# Patient Record
Sex: Female | Born: 1955 | Race: White | Hispanic: No | Smoking: Never smoker
Health system: Southern US, Community
[De-identification: ages and names within clinical notes are randomized; demographics above are authoritative.]

## PROBLEM LIST (undated history)

## (undated) DIAGNOSIS — J45909 Unspecified asthma, uncomplicated: Secondary | ICD-10-CM

## (undated) DIAGNOSIS — G629 Polyneuropathy, unspecified: Secondary | ICD-10-CM

## (undated) DIAGNOSIS — F419 Anxiety disorder, unspecified: Secondary | ICD-10-CM

## (undated) DIAGNOSIS — K219 Gastro-esophageal reflux disease without esophagitis: Secondary | ICD-10-CM

## (undated) DIAGNOSIS — I10 Essential (primary) hypertension: Secondary | ICD-10-CM

## (undated) DIAGNOSIS — G4730 Sleep apnea, unspecified: Secondary | ICD-10-CM

## (undated) DIAGNOSIS — T8859XA Other complications of anesthesia, initial encounter: Secondary | ICD-10-CM

## (undated) HISTORY — PX: HERNIA REPAIR: SHX51

## (undated) HISTORY — PX: COLOSTOMY TAKEDOWN: SHX5258

## (undated) HISTORY — PX: ILEOSTOMY: SHX1783

## (undated) HISTORY — PX: CHOLECYSTECTOMY: SHX55

## (undated) HISTORY — PX: COLOSTOMY: SHX63

## (undated) HISTORY — PX: ILEOSTOMY CLOSURE: SHX1784

## (undated) HISTORY — PX: APPENDECTOMY: SHX54

## (undated) HISTORY — PX: ABDOMINAL HYSTERECTOMY: SHX81

---

## 1998-06-12 ENCOUNTER — Other Ambulatory Visit: Admission: RE | Admit: 1998-06-12 | Discharge: 1998-06-12 | Payer: Self-pay | Admitting: Family Medicine

## 2000-07-26 ENCOUNTER — Encounter: Payer: Self-pay | Admitting: Urology

## 2000-07-26 ENCOUNTER — Encounter: Admission: RE | Admit: 2000-07-26 | Discharge: 2000-07-26 | Payer: Self-pay | Admitting: Urology

## 2000-08-29 ENCOUNTER — Ambulatory Visit (HOSPITAL_COMMUNITY): Admission: RE | Admit: 2000-08-29 | Discharge: 2000-08-29 | Payer: Self-pay | Admitting: Urology

## 2003-08-09 ENCOUNTER — Inpatient Hospital Stay (HOSPITAL_COMMUNITY): Admission: EM | Admit: 2003-08-09 | Discharge: 2003-08-11 | Payer: Self-pay | Admitting: Internal Medicine

## 2015-06-11 DIAGNOSIS — E669 Obesity, unspecified: Secondary | ICD-10-CM | POA: Diagnosis not present

## 2015-06-11 DIAGNOSIS — Z6841 Body Mass Index (BMI) 40.0 and over, adult: Secondary | ICD-10-CM | POA: Diagnosis not present

## 2015-06-11 DIAGNOSIS — Z87891 Personal history of nicotine dependence: Secondary | ICD-10-CM | POA: Diagnosis not present

## 2015-06-11 DIAGNOSIS — R109 Unspecified abdominal pain: Secondary | ICD-10-CM | POA: Diagnosis not present

## 2015-06-11 DIAGNOSIS — Z90710 Acquired absence of both cervix and uterus: Secondary | ICD-10-CM | POA: Diagnosis not present

## 2015-06-11 DIAGNOSIS — Z79891 Long term (current) use of opiate analgesic: Secondary | ICD-10-CM | POA: Diagnosis not present

## 2015-06-11 DIAGNOSIS — Z9889 Other specified postprocedural states: Secondary | ICD-10-CM | POA: Diagnosis not present

## 2015-06-11 DIAGNOSIS — G8929 Other chronic pain: Secondary | ICD-10-CM | POA: Diagnosis not present

## 2015-06-11 DIAGNOSIS — I10 Essential (primary) hypertension: Secondary | ICD-10-CM | POA: Diagnosis not present

## 2015-06-11 DIAGNOSIS — Z9049 Acquired absence of other specified parts of digestive tract: Secondary | ICD-10-CM | POA: Diagnosis not present

## 2015-06-11 DIAGNOSIS — Z7982 Long term (current) use of aspirin: Secondary | ICD-10-CM | POA: Diagnosis not present

## 2015-06-11 DIAGNOSIS — Z79899 Other long term (current) drug therapy: Secondary | ICD-10-CM | POA: Diagnosis not present

## 2015-06-11 DIAGNOSIS — Z885 Allergy status to narcotic agent status: Secondary | ICD-10-CM | POA: Diagnosis not present

## 2015-06-23 DIAGNOSIS — I10 Essential (primary) hypertension: Secondary | ICD-10-CM | POA: Diagnosis not present

## 2015-06-23 DIAGNOSIS — E669 Obesity, unspecified: Secondary | ICD-10-CM | POA: Diagnosis not present

## 2015-06-23 DIAGNOSIS — K660 Peritoneal adhesions (postprocedural) (postinfection): Secondary | ICD-10-CM | POA: Diagnosis not present

## 2015-06-23 DIAGNOSIS — F341 Dysthymic disorder: Secondary | ICD-10-CM | POA: Diagnosis not present

## 2015-06-23 DIAGNOSIS — R109 Unspecified abdominal pain: Secondary | ICD-10-CM | POA: Diagnosis not present

## 2015-06-23 DIAGNOSIS — Z6841 Body Mass Index (BMI) 40.0 and over, adult: Secondary | ICD-10-CM | POA: Diagnosis not present

## 2015-07-09 DIAGNOSIS — R1314 Dysphagia, pharyngoesophageal phase: Secondary | ICD-10-CM | POA: Diagnosis not present

## 2015-07-09 DIAGNOSIS — Z7982 Long term (current) use of aspirin: Secondary | ICD-10-CM | POA: Diagnosis not present

## 2015-07-09 DIAGNOSIS — K219 Gastro-esophageal reflux disease without esophagitis: Secondary | ICD-10-CM | POA: Diagnosis not present

## 2015-07-09 DIAGNOSIS — Z87891 Personal history of nicotine dependence: Secondary | ICD-10-CM | POA: Diagnosis not present

## 2015-07-09 DIAGNOSIS — K625 Hemorrhage of anus and rectum: Secondary | ICD-10-CM | POA: Diagnosis not present

## 2015-07-15 DIAGNOSIS — Z6841 Body Mass Index (BMI) 40.0 and over, adult: Secondary | ICD-10-CM | POA: Diagnosis not present

## 2015-07-15 DIAGNOSIS — J45901 Unspecified asthma with (acute) exacerbation: Secondary | ICD-10-CM | POA: Diagnosis not present

## 2015-07-15 DIAGNOSIS — H6691 Otitis media, unspecified, right ear: Secondary | ICD-10-CM | POA: Diagnosis not present

## 2015-07-15 DIAGNOSIS — J399 Disease of upper respiratory tract, unspecified: Secondary | ICD-10-CM | POA: Diagnosis not present

## 2015-07-16 DIAGNOSIS — F341 Dysthymic disorder: Secondary | ICD-10-CM | POA: Diagnosis not present

## 2015-07-16 DIAGNOSIS — E669 Obesity, unspecified: Secondary | ICD-10-CM | POA: Diagnosis not present

## 2015-07-16 DIAGNOSIS — I10 Essential (primary) hypertension: Secondary | ICD-10-CM | POA: Diagnosis not present

## 2015-07-16 DIAGNOSIS — Z6841 Body Mass Index (BMI) 40.0 and over, adult: Secondary | ICD-10-CM | POA: Diagnosis not present

## 2015-07-25 DIAGNOSIS — J180 Bronchopneumonia, unspecified organism: Secondary | ICD-10-CM | POA: Diagnosis not present

## 2015-07-25 DIAGNOSIS — Z6841 Body Mass Index (BMI) 40.0 and over, adult: Secondary | ICD-10-CM | POA: Diagnosis not present

## 2015-08-14 DIAGNOSIS — K210 Gastro-esophageal reflux disease with esophagitis: Secondary | ICD-10-CM | POA: Diagnosis not present

## 2015-08-14 DIAGNOSIS — Z79899 Other long term (current) drug therapy: Secondary | ICD-10-CM | POA: Diagnosis not present

## 2015-08-14 DIAGNOSIS — Z6837 Body mass index (BMI) 37.0-37.9, adult: Secondary | ICD-10-CM | POA: Diagnosis not present

## 2015-08-14 DIAGNOSIS — K219 Gastro-esophageal reflux disease without esophagitis: Secondary | ICD-10-CM | POA: Diagnosis not present

## 2015-08-14 DIAGNOSIS — R569 Unspecified convulsions: Secondary | ICD-10-CM | POA: Diagnosis not present

## 2015-08-14 DIAGNOSIS — Z8719 Personal history of other diseases of the digestive system: Secondary | ICD-10-CM | POA: Diagnosis not present

## 2015-08-14 DIAGNOSIS — Z7982 Long term (current) use of aspirin: Secondary | ICD-10-CM | POA: Diagnosis not present

## 2015-08-14 DIAGNOSIS — Z885 Allergy status to narcotic agent status: Secondary | ICD-10-CM | POA: Diagnosis not present

## 2015-08-14 DIAGNOSIS — R1310 Dysphagia, unspecified: Secondary | ICD-10-CM | POA: Diagnosis not present

## 2015-08-14 DIAGNOSIS — G4730 Sleep apnea, unspecified: Secondary | ICD-10-CM | POA: Diagnosis not present

## 2015-08-14 DIAGNOSIS — G894 Chronic pain syndrome: Secondary | ICD-10-CM | POA: Diagnosis not present

## 2015-08-14 DIAGNOSIS — Z1211 Encounter for screening for malignant neoplasm of colon: Secondary | ICD-10-CM | POA: Diagnosis not present

## 2015-08-14 DIAGNOSIS — Z91041 Radiographic dye allergy status: Secondary | ICD-10-CM | POA: Diagnosis not present

## 2015-08-14 DIAGNOSIS — Z87891 Personal history of nicotine dependence: Secondary | ICD-10-CM | POA: Diagnosis not present

## 2015-08-14 DIAGNOSIS — Z90710 Acquired absence of both cervix and uterus: Secondary | ICD-10-CM | POA: Diagnosis not present

## 2015-08-14 DIAGNOSIS — Z9049 Acquired absence of other specified parts of digestive tract: Secondary | ICD-10-CM | POA: Diagnosis not present

## 2015-08-14 DIAGNOSIS — G629 Polyneuropathy, unspecified: Secondary | ICD-10-CM | POA: Diagnosis not present

## 2015-08-14 DIAGNOSIS — F419 Anxiety disorder, unspecified: Secondary | ICD-10-CM | POA: Diagnosis not present

## 2015-08-14 DIAGNOSIS — K625 Hemorrhage of anus and rectum: Secondary | ICD-10-CM | POA: Diagnosis not present

## 2015-08-14 DIAGNOSIS — K208 Other esophagitis: Secondary | ICD-10-CM | POA: Diagnosis not present

## 2015-08-14 DIAGNOSIS — I10 Essential (primary) hypertension: Secondary | ICD-10-CM | POA: Diagnosis not present

## 2015-08-14 DIAGNOSIS — Z9119 Patient's noncompliance with other medical treatment and regimen: Secondary | ICD-10-CM | POA: Diagnosis not present

## 2015-08-19 DIAGNOSIS — Z1231 Encounter for screening mammogram for malignant neoplasm of breast: Secondary | ICD-10-CM | POA: Diagnosis not present

## 2015-09-11 DIAGNOSIS — F1120 Opioid dependence, uncomplicated: Secondary | ICD-10-CM | POA: Diagnosis not present

## 2015-09-11 DIAGNOSIS — R109 Unspecified abdominal pain: Secondary | ICD-10-CM | POA: Diagnosis not present

## 2015-09-11 DIAGNOSIS — G894 Chronic pain syndrome: Secondary | ICD-10-CM | POA: Diagnosis not present

## 2015-09-11 DIAGNOSIS — G4730 Sleep apnea, unspecified: Secondary | ICD-10-CM | POA: Diagnosis not present

## 2015-09-11 DIAGNOSIS — F419 Anxiety disorder, unspecified: Secondary | ICD-10-CM | POA: Diagnosis not present

## 2015-10-22 DIAGNOSIS — G8929 Other chronic pain: Secondary | ICD-10-CM | POA: Diagnosis not present

## 2015-10-22 DIAGNOSIS — Z5181 Encounter for therapeutic drug level monitoring: Secondary | ICD-10-CM | POA: Diagnosis not present

## 2015-10-22 DIAGNOSIS — Z79899 Other long term (current) drug therapy: Secondary | ICD-10-CM | POA: Diagnosis not present

## 2015-10-22 DIAGNOSIS — R109 Unspecified abdominal pain: Secondary | ICD-10-CM | POA: Diagnosis not present

## 2015-10-29 DIAGNOSIS — L02211 Cutaneous abscess of abdominal wall: Secondary | ICD-10-CM | POA: Diagnosis not present

## 2015-10-30 DIAGNOSIS — L02211 Cutaneous abscess of abdominal wall: Secondary | ICD-10-CM | POA: Diagnosis not present

## 2016-04-21 DIAGNOSIS — R600 Localized edema: Secondary | ICD-10-CM | POA: Diagnosis not present

## 2016-04-21 DIAGNOSIS — M79604 Pain in right leg: Secondary | ICD-10-CM | POA: Diagnosis not present

## 2016-04-21 DIAGNOSIS — Z7689 Persons encountering health services in other specified circumstances: Secondary | ICD-10-CM | POA: Diagnosis not present

## 2016-04-27 DIAGNOSIS — E1165 Type 2 diabetes mellitus with hyperglycemia: Secondary | ICD-10-CM | POA: Diagnosis not present

## 2016-04-27 DIAGNOSIS — M79604 Pain in right leg: Secondary | ICD-10-CM | POA: Diagnosis not present

## 2016-04-27 DIAGNOSIS — Z6841 Body Mass Index (BMI) 40.0 and over, adult: Secondary | ICD-10-CM | POA: Diagnosis not present

## 2016-04-27 DIAGNOSIS — Z713 Dietary counseling and surveillance: Secondary | ICD-10-CM | POA: Diagnosis not present

## 2016-04-27 DIAGNOSIS — R600 Localized edema: Secondary | ICD-10-CM | POA: Diagnosis not present

## 2016-05-12 DIAGNOSIS — R600 Localized edema: Secondary | ICD-10-CM | POA: Diagnosis not present

## 2016-05-12 DIAGNOSIS — E119 Type 2 diabetes mellitus without complications: Secondary | ICD-10-CM | POA: Diagnosis not present

## 2016-05-12 DIAGNOSIS — Z713 Dietary counseling and surveillance: Secondary | ICD-10-CM | POA: Diagnosis not present

## 2016-05-12 DIAGNOSIS — I10 Essential (primary) hypertension: Secondary | ICD-10-CM | POA: Diagnosis not present

## 2016-05-12 DIAGNOSIS — E669 Obesity, unspecified: Secondary | ICD-10-CM | POA: Diagnosis not present

## 2016-05-12 DIAGNOSIS — Z6841 Body Mass Index (BMI) 40.0 and over, adult: Secondary | ICD-10-CM | POA: Diagnosis not present

## 2016-05-25 DIAGNOSIS — E1165 Type 2 diabetes mellitus with hyperglycemia: Secondary | ICD-10-CM | POA: Diagnosis not present

## 2016-06-09 DIAGNOSIS — E669 Obesity, unspecified: Secondary | ICD-10-CM | POA: Diagnosis not present

## 2016-06-09 DIAGNOSIS — I10 Essential (primary) hypertension: Secondary | ICD-10-CM | POA: Diagnosis not present

## 2016-06-09 DIAGNOSIS — E119 Type 2 diabetes mellitus without complications: Secondary | ICD-10-CM | POA: Diagnosis not present

## 2016-06-09 DIAGNOSIS — Z6841 Body Mass Index (BMI) 40.0 and over, adult: Secondary | ICD-10-CM | POA: Diagnosis not present

## 2016-07-15 DIAGNOSIS — E1165 Type 2 diabetes mellitus with hyperglycemia: Secondary | ICD-10-CM | POA: Diagnosis not present

## 2017-05-12 DIAGNOSIS — E119 Type 2 diabetes mellitus without complications: Secondary | ICD-10-CM | POA: Diagnosis not present

## 2017-05-12 DIAGNOSIS — H25813 Combined forms of age-related cataract, bilateral: Secondary | ICD-10-CM | POA: Diagnosis not present

## 2017-05-12 DIAGNOSIS — H11153 Pinguecula, bilateral: Secondary | ICD-10-CM | POA: Diagnosis not present

## 2017-06-23 DIAGNOSIS — G4737 Central sleep apnea in conditions classified elsewhere: Secondary | ICD-10-CM | POA: Diagnosis not present

## 2017-06-23 DIAGNOSIS — I10 Essential (primary) hypertension: Secondary | ICD-10-CM | POA: Diagnosis not present

## 2017-06-23 DIAGNOSIS — G629 Polyneuropathy, unspecified: Secondary | ICD-10-CM | POA: Diagnosis not present

## 2017-06-23 DIAGNOSIS — Z1159 Encounter for screening for other viral diseases: Secondary | ICD-10-CM | POA: Diagnosis not present

## 2017-07-21 DIAGNOSIS — Z1231 Encounter for screening mammogram for malignant neoplasm of breast: Secondary | ICD-10-CM | POA: Diagnosis not present

## 2017-07-21 DIAGNOSIS — M85851 Other specified disorders of bone density and structure, right thigh: Secondary | ICD-10-CM | POA: Diagnosis not present

## 2017-07-21 DIAGNOSIS — N959 Unspecified menopausal and perimenopausal disorder: Secondary | ICD-10-CM | POA: Diagnosis not present

## 2017-07-25 DIAGNOSIS — R05 Cough: Secondary | ICD-10-CM | POA: Diagnosis not present

## 2017-07-25 DIAGNOSIS — J208 Acute bronchitis due to other specified organisms: Secondary | ICD-10-CM | POA: Diagnosis not present

## 2017-07-25 DIAGNOSIS — B9689 Other specified bacterial agents as the cause of diseases classified elsewhere: Secondary | ICD-10-CM | POA: Diagnosis not present

## 2017-08-09 DIAGNOSIS — R062 Wheezing: Secondary | ICD-10-CM | POA: Diagnosis not present

## 2017-08-09 DIAGNOSIS — Z0000 Encounter for general adult medical examination without abnormal findings: Secondary | ICD-10-CM | POA: Diagnosis not present

## 2017-08-09 DIAGNOSIS — E1142 Type 2 diabetes mellitus with diabetic polyneuropathy: Secondary | ICD-10-CM | POA: Diagnosis not present

## 2017-08-09 DIAGNOSIS — E1165 Type 2 diabetes mellitus with hyperglycemia: Secondary | ICD-10-CM | POA: Diagnosis not present

## 2017-08-09 DIAGNOSIS — Z13220 Encounter for screening for lipoid disorders: Secondary | ICD-10-CM | POA: Diagnosis not present

## 2017-08-09 DIAGNOSIS — I10 Essential (primary) hypertension: Secondary | ICD-10-CM | POA: Diagnosis not present

## 2017-08-09 DIAGNOSIS — K219 Gastro-esophageal reflux disease without esophagitis: Secondary | ICD-10-CM | POA: Diagnosis not present

## 2017-08-09 DIAGNOSIS — E559 Vitamin D deficiency, unspecified: Secondary | ICD-10-CM | POA: Diagnosis not present

## 2020-09-01 ENCOUNTER — Telehealth: Payer: Self-pay | Admitting: Family Medicine

## 2020-09-01 ENCOUNTER — Inpatient Hospital Stay (HOSPITAL_COMMUNITY)
Admission: AD | Admit: 2020-09-01 | Discharge: 2020-09-15 | DRG: 480 | Disposition: A | Discharge status: Home Health | Payer: Medicare HMO | Source: Other Acute Inpatient Hospital | Attending: Internal Medicine | Admitting: Internal Medicine

## 2020-09-01 DIAGNOSIS — J9602 Acute respiratory failure with hypercapnia: Secondary | ICD-10-CM | POA: Diagnosis present

## 2020-09-01 DIAGNOSIS — E1122 Type 2 diabetes mellitus with diabetic chronic kidney disease: Secondary | ICD-10-CM | POA: Diagnosis present

## 2020-09-01 DIAGNOSIS — W1830XA Fall on same level, unspecified, initial encounter: Secondary | ICD-10-CM | POA: Diagnosis present

## 2020-09-01 DIAGNOSIS — Z91041 Radiographic dye allergy status: Secondary | ICD-10-CM | POA: Diagnosis not present

## 2020-09-01 DIAGNOSIS — Z419 Encounter for procedure for purposes other than remedying health state, unspecified: Secondary | ICD-10-CM

## 2020-09-01 DIAGNOSIS — S7291XD Unspecified fracture of right femur, subsequent encounter for closed fracture with routine healing: Secondary | ICD-10-CM | POA: Diagnosis not present

## 2020-09-01 DIAGNOSIS — K219 Gastro-esophageal reflux disease without esophagitis: Secondary | ICD-10-CM | POA: Diagnosis present

## 2020-09-01 DIAGNOSIS — M7041 Prepatellar bursitis, right knee: Secondary | ICD-10-CM | POA: Diagnosis present

## 2020-09-01 DIAGNOSIS — G9341 Metabolic encephalopathy: Secondary | ICD-10-CM | POA: Diagnosis present

## 2020-09-01 DIAGNOSIS — Z885 Allergy status to narcotic agent status: Secondary | ICD-10-CM | POA: Diagnosis not present

## 2020-09-01 DIAGNOSIS — S72451A Displaced supracondylar fracture without intracondylar extension of lower end of right femur, initial encounter for closed fracture: Principal | ICD-10-CM | POA: Diagnosis present

## 2020-09-01 DIAGNOSIS — E1140 Type 2 diabetes mellitus with diabetic neuropathy, unspecified: Secondary | ICD-10-CM | POA: Diagnosis present

## 2020-09-01 DIAGNOSIS — Z6841 Body Mass Index (BMI) 40.0 and over, adult: Secondary | ICD-10-CM

## 2020-09-01 DIAGNOSIS — Z20822 Contact with and (suspected) exposure to covid-19: Secondary | ICD-10-CM | POA: Diagnosis present

## 2020-09-01 DIAGNOSIS — G4733 Obstructive sleep apnea (adult) (pediatric): Secondary | ICD-10-CM | POA: Diagnosis present

## 2020-09-01 DIAGNOSIS — N1832 Chronic kidney disease, stage 3b: Secondary | ICD-10-CM | POA: Diagnosis present

## 2020-09-01 DIAGNOSIS — D638 Anemia in other chronic diseases classified elsewhere: Secondary | ICD-10-CM | POA: Diagnosis present

## 2020-09-01 DIAGNOSIS — J9601 Acute respiratory failure with hypoxia: Secondary | ICD-10-CM | POA: Diagnosis present

## 2020-09-01 DIAGNOSIS — Z823 Family history of stroke: Secondary | ICD-10-CM | POA: Diagnosis not present

## 2020-09-01 DIAGNOSIS — Z8249 Family history of ischemic heart disease and other diseases of the circulatory system: Secondary | ICD-10-CM | POA: Diagnosis not present

## 2020-09-01 DIAGNOSIS — Z7984 Long term (current) use of oral hypoglycemic drugs: Secondary | ICD-10-CM

## 2020-09-01 DIAGNOSIS — E11649 Type 2 diabetes mellitus with hypoglycemia without coma: Secondary | ICD-10-CM | POA: Diagnosis present

## 2020-09-01 DIAGNOSIS — S7291XA Unspecified fracture of right femur, initial encounter for closed fracture: Secondary | ICD-10-CM | POA: Diagnosis not present

## 2020-09-01 DIAGNOSIS — S7290XA Unspecified fracture of unspecified femur, initial encounter for closed fracture: Secondary | ICD-10-CM | POA: Diagnosis present

## 2020-09-01 DIAGNOSIS — I1310 Hypertensive heart and chronic kidney disease without heart failure, with stage 1 through stage 4 chronic kidney disease, or unspecified chronic kidney disease: Secondary | ICD-10-CM | POA: Diagnosis present

## 2020-09-01 DIAGNOSIS — Y92512 Supermarket, store or market as the place of occurrence of the external cause: Secondary | ICD-10-CM | POA: Diagnosis not present

## 2020-09-01 DIAGNOSIS — Z833 Family history of diabetes mellitus: Secondary | ICD-10-CM | POA: Diagnosis not present

## 2020-09-01 DIAGNOSIS — E119 Type 2 diabetes mellitus without complications: Secondary | ICD-10-CM

## 2020-09-01 DIAGNOSIS — J9600 Acute respiratory failure, unspecified whether with hypoxia or hypercapnia: Secondary | ICD-10-CM

## 2020-09-01 DIAGNOSIS — D62 Acute posthemorrhagic anemia: Secondary | ICD-10-CM | POA: Diagnosis not present

## 2020-09-01 DIAGNOSIS — G9340 Encephalopathy, unspecified: Secondary | ICD-10-CM

## 2020-09-01 DIAGNOSIS — Z09 Encounter for follow-up examination after completed treatment for conditions other than malignant neoplasm: Secondary | ICD-10-CM

## 2020-09-01 DIAGNOSIS — T148XXA Other injury of unspecified body region, initial encounter: Secondary | ICD-10-CM

## 2020-09-01 DIAGNOSIS — J45909 Unspecified asthma, uncomplicated: Secondary | ICD-10-CM | POA: Diagnosis present

## 2020-09-01 DIAGNOSIS — I10 Essential (primary) hypertension: Secondary | ICD-10-CM | POA: Diagnosis not present

## 2020-09-01 HISTORY — DX: Unspecified asthma, uncomplicated: J45.909

## 2020-09-01 HISTORY — DX: Other complications of anesthesia, initial encounter: T88.59XA

## 2020-09-01 HISTORY — DX: Essential (primary) hypertension: I10

## 2020-09-01 NOTE — Telephone Encounter (Signed)
Dr. Ebbie Latus from [facility] with transfer. Accepted yesterday; however, she has had a change in condition and requiring a different placement.   65 year old with distal femur fracture that needs trauma eval. Became altered today and had elevated Co2 and was started on bipap. She is awake, alert and slow to respond, but doing well with current bipap settings.  He believes co2 encephalopathy, no evidence of cva. Has AKI, otherwise wnl. They had spoken to trauma, Dr. Eulah Pont who will see her when she arrives, but he will need to be consulted.   Needs progressive bed with tele. Please call admitting provider when she gets here.   Dr. Artis Flock  [facility].

## 2020-09-02 ENCOUNTER — Encounter (HOSPITAL_COMMUNITY): Payer: Self-pay | Admitting: Internal Medicine

## 2020-09-02 ENCOUNTER — Inpatient Hospital Stay (HOSPITAL_COMMUNITY): Payer: Medicare HMO

## 2020-09-02 ENCOUNTER — Other Ambulatory Visit: Payer: Self-pay

## 2020-09-02 DIAGNOSIS — S7290XA Unspecified fracture of unspecified femur, initial encounter for closed fracture: Secondary | ICD-10-CM | POA: Diagnosis present

## 2020-09-02 DIAGNOSIS — I10 Essential (primary) hypertension: Secondary | ICD-10-CM

## 2020-09-02 DIAGNOSIS — S7291XA Unspecified fracture of right femur, initial encounter for closed fracture: Secondary | ICD-10-CM

## 2020-09-02 DIAGNOSIS — G9340 Encephalopathy, unspecified: Secondary | ICD-10-CM

## 2020-09-02 DIAGNOSIS — S7291XD Unspecified fracture of right femur, subsequent encounter for closed fracture with routine healing: Secondary | ICD-10-CM

## 2020-09-02 DIAGNOSIS — J9600 Acute respiratory failure, unspecified whether with hypoxia or hypercapnia: Secondary | ICD-10-CM

## 2020-09-02 DIAGNOSIS — E119 Type 2 diabetes mellitus without complications: Secondary | ICD-10-CM

## 2020-09-02 HISTORY — DX: Type 2 diabetes mellitus without complications: E11.9

## 2020-09-02 LAB — BLOOD GAS, ARTERIAL
Acid-Base Excess: 2.4 mmol/L — ABNORMAL HIGH (ref 0.0–2.0)
Acid-Base Excess: 1.3 mmol/L (ref 0.0–2.0)
Bicarbonate: 29.1 mmol/L — ABNORMAL HIGH (ref 20.0–28.0)
Bicarbonate: 27.8 mmol/L (ref 20.0–28.0)
FIO2: 36
FIO2: 40
O2 Saturation: 98 %
O2 Saturation: 99.8 %
Patient temperature: 37.4
Patient temperature: 37
pCO2 arterial: 71.3 mmHg (ref 32.0–48.0)
pCO2 arterial: 65.4 mmHg (ref 32.0–48.0)
pH, Arterial: 7.238 — ABNORMAL LOW (ref 7.350–7.450)
pH, Arterial: 7.252 — ABNORMAL LOW (ref 7.350–7.450)
pO2, Arterial: 110 mmHg — ABNORMAL HIGH (ref 83.0–108.0)
pO2, Arterial: 413 mmHg — ABNORMAL HIGH (ref 83.0–108.0)

## 2020-09-02 LAB — SARS CORONAVIRUS 2 (TAT 6-24 HRS): SARS Coronavirus 2: NEGATIVE

## 2020-09-02 LAB — SURGICAL PCR SCREEN
MRSA, PCR: NEGATIVE
Staphylococcus aureus: NEGATIVE

## 2020-09-02 LAB — GLUCOSE, CAPILLARY
Glucose-Capillary: 162 mg/dL — ABNORMAL HIGH (ref 70–99)
Glucose-Capillary: 175 mg/dL — ABNORMAL HIGH (ref 70–99)
Glucose-Capillary: 162 mg/dL — ABNORMAL HIGH (ref 70–99)
Glucose-Capillary: 136 mg/dL — ABNORMAL HIGH (ref 70–99)
Glucose-Capillary: 176 mg/dL — ABNORMAL HIGH (ref 70–99)
Glucose-Capillary: 149 mg/dL — ABNORMAL HIGH (ref 70–99)

## 2020-09-02 LAB — HIV ANTIBODY (ROUTINE TESTING W REFLEX): HIV Screen 4th Generation wRfx: NONREACTIVE

## 2020-09-02 LAB — AMMONIA: Ammonia: 52 umol/L — ABNORMAL HIGH (ref 9–35)

## 2020-09-02 MED ORDER — CEFAZOLIN IN SODIUM CHLORIDE 3-0.9 GM/100ML-% IV SOLN
3.0000 g | INTRAVENOUS | Status: AC
  Administered 2020-09-03: 3 g via INTRAVENOUS
  Filled 2020-09-02 (×2): qty 100

## 2020-09-02 MED ORDER — ACETAMINOPHEN 325 MG PO TABS
650.0000 mg | ORAL_TABLET | Freq: Four times a day (QID) | ORAL | Status: DC | PRN
  Administered 2020-09-10 – 2020-09-14 (×5): 650 mg via ORAL
  Filled 2020-09-02 (×6): qty 2

## 2020-09-02 MED ORDER — HYDROMORPHONE HCL 1 MG/ML IJ SOLN
1.0000 mg | INTRAMUSCULAR | Status: DC | PRN
  Administered 2020-09-02 – 2020-09-03 (×2): 1 mg via INTRAVENOUS
  Filled 2020-09-02 (×2): qty 1

## 2020-09-02 MED ORDER — CHLORHEXIDINE GLUCONATE 4 % EX LIQD
60.0000 mL | Freq: Once | CUTANEOUS | Status: AC
  Administered 2020-09-02: 4 via TOPICAL
  Filled 2020-09-02: qty 60

## 2020-09-02 MED ORDER — INSULIN ASPART 100 UNIT/ML IJ SOLN
0.0000 [IU] | INTRAMUSCULAR | Status: DC
  Administered 2020-09-02 – 2020-09-03 (×9): 2 [IU] via SUBCUTANEOUS
  Administered 2020-09-03 – 2020-09-04 (×3): 1 [IU] via SUBCUTANEOUS
  Administered 2020-09-04 (×2): 2 [IU] via SUBCUTANEOUS
  Administered 2020-09-05: 1 [IU] via SUBCUTANEOUS
  Administered 2020-09-05 (×3): 2 [IU] via SUBCUTANEOUS
  Administered 2020-09-06 (×4): 1 [IU] via SUBCUTANEOUS
  Administered 2020-09-06 (×2): 2 [IU] via SUBCUTANEOUS
  Administered 2020-09-07: 1 [IU] via SUBCUTANEOUS
  Administered 2020-09-07 (×2): 2 [IU] via SUBCUTANEOUS
  Administered 2020-09-07 (×2): 1 [IU] via SUBCUTANEOUS
  Administered 2020-09-08: 2 [IU] via SUBCUTANEOUS
  Administered 2020-09-08: 1 [IU] via SUBCUTANEOUS
  Administered 2020-09-08: 2 [IU] via SUBCUTANEOUS
  Administered 2020-09-08 – 2020-09-09 (×4): 1 [IU] via SUBCUTANEOUS
  Administered 2020-09-09 (×2): 2 [IU] via SUBCUTANEOUS
  Administered 2020-09-09 – 2020-09-10 (×4): 1 [IU] via SUBCUTANEOUS
  Administered 2020-09-10 (×2): 2 [IU] via SUBCUTANEOUS
  Administered 2020-09-10: 3 [IU] via SUBCUTANEOUS
  Administered 2020-09-10 – 2020-09-11 (×2): 1 [IU] via SUBCUTANEOUS
  Administered 2020-09-11: 2 [IU] via SUBCUTANEOUS
  Administered 2020-09-11: 3 [IU] via SUBCUTANEOUS
  Administered 2020-09-11 – 2020-09-12 (×4): 2 [IU] via SUBCUTANEOUS
  Administered 2020-09-12 (×2): 1 [IU] via SUBCUTANEOUS
  Administered 2020-09-12: 3 [IU] via SUBCUTANEOUS
  Administered 2020-09-13 (×6): 2 [IU] via SUBCUTANEOUS
  Administered 2020-09-14: 1 [IU] via SUBCUTANEOUS
  Administered 2020-09-14: 2 [IU] via SUBCUTANEOUS
  Administered 2020-09-14: 3 [IU] via SUBCUTANEOUS
  Administered 2020-09-14: 1 [IU] via SUBCUTANEOUS
  Administered 2020-09-14: 2 [IU] via SUBCUTANEOUS
  Administered 2020-09-14: 1 [IU] via SUBCUTANEOUS
  Administered 2020-09-15 (×2): 2 [IU] via SUBCUTANEOUS

## 2020-09-02 MED ORDER — POVIDONE-IODINE 10 % EX SWAB: 2.0000 "application " | Freq: Once | CUTANEOUS | Status: DC

## 2020-09-02 MED ORDER — MUPIROCIN 2 % EX OINT
1.0000 "application " | TOPICAL_OINTMENT | Freq: Two times a day (BID) | CUTANEOUS | Status: DC
  Administered 2020-09-02: 1 via NASAL
  Filled 2020-09-02: qty 22

## 2020-09-02 MED ORDER — ACETAMINOPHEN 650 MG RE SUPP: 650.0000 mg | Freq: Four times a day (QID) | RECTAL | Status: DC | PRN

## 2020-09-02 NOTE — Progress Notes (Signed)
Page sent to [facility], patient arrived from [facility]. CHG bath given, vitals WNL. Pt currently on 4L via [place] SPO2 currently 94%.

## 2020-09-02 NOTE — Progress Notes (Signed)
RT note. Message sent to   G. Shalhoub  In regards to abg results and placing bipap on patient. Patient currently on 4L [place] sat 97% w/ stable VS. RT will continue to monitor

## 2020-09-02 NOTE — H&P (View-Only) (Signed)
Reason for Consult:Right distal femur fx Referring Physician: Kathlen Mody Time called: [phone] Time at bedside: 0959   Rebecca Kemp is an 65 y.o. female.  HPI: Rebecca Kemp was at the grocery store and fell. She's unsure exactly how it happened. She had immediate right knee pain and could not get up or bear weight. She was taken to [facility] where workup showed a distal femur fx. She was transferred to [facility] and orthopedic surgery was consulted. She lives at home, does not use any assistive devices to ambulate, and is retired.  Past Medical History:  Diagnosis Date  . Asthma   . Diabetes (HCC) 09/02/2020  . Hypertension     No family history on file.  Social History:  has no history on file for tobacco use, alcohol use, and drug use.  Allergies: Not on File  Medications: I have reviewed the patient's current medications.  Results for orders placed or performed during the hospital encounter of 09/01/20 (from the past 48 hour(s))  Blood gas, arterial     Status: Abnormal   Collection Time: 09/02/20 12:52 AM  Result Value Ref Range   FIO2 36.00    pH, Arterial 7.238 (L) 7.350 - 7.450   pCO2 arterial 71.3 (HH) 32.0 - 48.0 mmHg    Comment: CRITICAL RESULT CALLED TO, READ BACK BY AND VERIFIED WITH: J CURETON,RN 0114 08/30/2020 WILDERK    pO2, Arterial 110 (H) 83.0 - 108.0 mmHg   Bicarbonate 29.1 (H) 20.0 - 28.0 mmol/L   Acid-Base Excess 2.4 (H) 0.0 - 2.0 mmol/L   O2 Saturation 98.0 %   Patient temperature 37.4    Collection site LEFT RADIAL    Drawn by COLLECTED BY RT     Comment: LILLIAN H   Sample type ARTERIAL DRAW    Allens test (pass/fail) PASS PASS    Comment: Performed at [facility], [address], [place], [place] [address]  SARS CORONAVIRUS 2 (TAT 6-24 HRS) Nasopharyngeal Nasopharyngeal Swab     Status: None   Collection Time: 09/02/20  3:20 AM   Specimen: Nasopharyngeal Swab  Result Value Ref Range   SARS Coronavirus 2 NEGATIVE NEGATIVE    Comment:  (NOTE) SARS-CoV-2 target nucleic acids are NOT DETECTED.  The SARS-CoV-2 RNA is generally detectable in upper and lower respiratory specimens during the acute phase of infection. Negative results do not preclude SARS-CoV-2 infection, do not rule out co-infections with other pathogens, and should not be used as the sole basis for treatment or other patient management decisions. Negative results must be combined with clinical observations, patient history, and epidemiological information. The expected result is Negative.  Fact Sheet for Patients: HairSlick.no  Fact Sheet for Healthcare Providers: quierodirigir.com  This test is not yet approved or cleared by the [place] FDA and  has been authorized for detection and/or diagnosis of SARS-CoV-2 by FDA under an Emergency Use Authorization (EUA). This EUA will remain  in effect (meaning this test can be used) for the duration of the COVID-19 declaration under Se ction 564(b)(1) of the Act, 21 U.S.C. section 360bbb-3(b)(1), unless the authorization is terminated or revoked sooner.  Performed at [facility], [address], [place], [place] [address]   Glucose, capillary     Status: Abnormal   Collection Time: 09/02/20  3:29 AM  Result Value Ref Range   Glucose-Capillary 162 (H) 70 - 99 mg/dL    Comment: Glucose reference range applies only to samples taken after fasting for at  least 8 hours.  Blood gas, arterial     Status: Abnormal   Collection Time: 09/02/20  4:30 AM  Result Value Ref Range   FIO2 40.00    pH, Arterial 7.252 (L) 7.350 - 7.450   pCO2 arterial 65.4 (HH) 32.0 - 48.0 mmHg    Comment: CRITICAL RESULT CALLED TO, READ BACK BY AND VERIFIED WITH: J CURETON,RN 09/02/2020 0446 WILDERK    pO2, Arterial 413 (H) 83.0 - 108.0 mmHg   Bicarbonate 27.8 20.0 - 28.0 mmol/L   Acid-Base Excess 1.3 0.0 - 2.0 mmol/L   O2 Saturation 99.8 %   Patient temperature 37.0     Collection site LEFT RADIAL    Drawn by COLLECTED BY RT     Comment: LILLIAN H   Sample type ARTERIAL DRAW    Allens test (pass/fail) PASS PASS    Comment: Performed at [facility], [address] [place], [place] [address]  Ammonia     Status: Abnormal   Collection Time: 09/02/20  5:15 AM  Result Value Ref Range   Ammonia 52 (H) 9 - 35 umol/L    Comment: Performed at [facility], [address], [place], [place] [address]  Glucose, capillary     Status: Abnormal   Collection Time: 09/02/20  8:51 AM  Result Value Ref Range   Glucose-Capillary 175 (H) 70 - 99 mg/dL    Comment: Glucose reference range applies only to samples taken after fasting for at least 8 hours.    CT HEAD WO CONTRAST  Result Date: 09/02/2020 CLINICAL DATA:  65 year old female with altered mental status and fall. EXAM: CT HEAD WITHOUT CONTRAST TECHNIQUE: Contiguous axial images were obtained from the base of the skull through the vertex without intravenous contrast. COMPARISON:  [facility] Brain MRI 04/01/2010. Head CT 05/23/2014. FINDINGS: Brain: Cerebral volume is not significantly changed since 2016. No midline shift, ventriculomegaly, mass effect, evidence of mass lesion, intracranial hemorrhage or evidence of cortically based acute infarction. Hypodensity in the bilateral anterior limb white matter internal capsules has progressed since 2016. Mild associated heterogeneity in the left thalamus is new. But mild for age subcortical white matter hypodensity elsewhere. No cortical encephalomalacia identified. Small dystrophic calcification left lentiform. Vascular: No suspicious intracranial vascular hyperdensity. Skull: Intact.  No acute osseous abnormality identified. Sinuses/Orbits: Trace fluid in the hyperplastic left sphenoid sinus is chronic. Other visualized paranasal sinuses and mastoids are clear. Other: No orbit or scalp soft tissue injury identified. IMPRESSION: 1. No acute intracranial  abnormality or acute traumatic injury identified. 2. 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 Electronically Signed   By: Odessa Fleming M.D.   On: 09/02/2020 04:32    Review of Systems  HENT:  Negative for ear discharge, ear pain, hearing loss and tinnitus.   Eyes:  Negative for photophobia and pain.  Respiratory:  Negative for cough and shortness of breath.   Cardiovascular:  Negative for chest pain.  Gastrointestinal:  Negative for abdominal pain, nausea and vomiting.  Genitourinary:  Negative for dysuria, flank pain, frequency and urgency.  Musculoskeletal:  Positive for arthralgias (Right knee). Negative for back pain, myalgias and neck pain.  Neurological:  Negative for dizziness and headaches.  Hematological:  Does not bruise/bleed easily.  Psychiatric/Behavioral:  The patient is not nervous/anxious.   Blood pressure (!) 148/66, pulse 84, temperature 98.7 F (37.1 C), temperature source Axillary, resp. rate (!) 22, weight (!) 196 kg, SpO2 100 %.  Physical Exam Constitutional:      General: She is not in acute distress.    Appearance: She is well-developed. She is not diaphoretic.  HENT:     Head: Normocephalic and atraumatic.  Eyes:     General: No scleral icterus.       Right eye: No discharge.        Left eye: No discharge.     Conjunctiva/sclera: Conjunctivae normal.  Cardiovascular:     Rate and Rhythm: Normal rate and regular rhythm.  Pulmonary:     Effort: Pulmonary effort is normal. No respiratory distress.  Musculoskeletal:     Cervical back: Normal range of motion.     Comments: RLE No traumatic wounds, ecchymosis, or rash  Long leg splint in place  Sens DPN, SPN, TN intact  Motor EHL 5/5  Cap refill <2s, No significant edema  Skin:    General: Skin is warm and dry.  Neurological:     Mental Status: She is alert.  Psychiatric:        Mood and Affect: Mood normal.        Behavior: Behavior normal.     Assessment/Plan: Right distal femur fx -- Plan ORIF Multiple medical problems including hypertension, asthma, type 2 diabetes, and GERD -- per primary service    Freeman Caldron, PA-C Orthopedic Surgery [phone] 09/02/2020, 10:08 AM

## 2020-09-02 NOTE — Progress Notes (Signed)
Pt placed on BIPAP for night rest.  Tolerating well. 

## 2020-09-02 NOTE — Progress Notes (Signed)
RT note. Patient transported to CT and back on bipap without any complications, next abg collected

## 2020-09-02 NOTE — Progress Notes (Signed)
Two phlebotomists attempted to draw ordered labs and were unable to. RN notified attending provider. No new orders. Brynda Rim, RN

## 2020-09-02 NOTE — Progress Notes (Signed)
RT note. Patient placed on bipap per abg/md order. 16/6R20 40%. Sat99% w/ stable VS> RT will continue to monitor

## 2020-09-02 NOTE — Progress Notes (Signed)
Rebecca Kemp is a 65 y.o. female with medical history significant of hypertension, asthma, type 2 diabetes, GERD presented to [facility] ED via EMS for evaluation of right leg injury and inability to ambulate after a mechanical fall at a grocery store. Pt seen and examined at bedside.   Patient was seen by orthopedics at [facility] who recommended transfer to a tertiary care center for trauma specialist.  Patient was placed in a long leg splint with 45 degrees of knee flexion.  ED physician spoke to Dr. Eulah Pont with orthopedics at [facility] who agreed to see the patient in consultation.  Patient had also complained of left rib pain, left hand pain, and right hip pain.  X-rays of left ribs and chest, left hand, and right hip/pelvis negative for acute finding.  Per signout from accepting physician Dr. Sheppard Penton, ED had also reported altered mental status and elevated CO2 levels for which patient was started on BiPAP.  Orthopedics consulted , pending evaluation.   Continue to monitor.   Kathlen Mody , MD

## 2020-09-02 NOTE — Consult Note (Signed)
Reason for Consult:Right distal femur fx Referring Physician: Kathlen Mody Time called: [phone] Time at bedside: 0959   Rebecca Kemp is an 65 y.o. female.  HPI: Rebecca Kemp was at the grocery store and fell. She's unsure exactly how it happened. She had immediate right knee pain and could not get up or bear weight. She was taken to [facility] where workup showed a distal femur fx. She was transferred to [facility] and orthopedic surgery was consulted. She lives at home, does not use any assistive devices to ambulate, and is retired.  Past Medical History:  Diagnosis Date  . Asthma   . Diabetes (HCC) 09/02/2020  . Hypertension     No family history on file.  Social History:  has no history on file for tobacco use, alcohol use, and drug use.  Allergies: Not on File  Medications: I have reviewed the patient's current medications.  Results for orders placed or performed during the hospital encounter of 09/01/20 (from the past 48 hour(s))  Blood gas, arterial     Status: Abnormal   Collection Time: 09/02/20 12:52 AM  Result Value Ref Range   FIO2 36.00    pH, Arterial 7.238 (L) 7.350 - 7.450   pCO2 arterial 71.3 (HH) 32.0 - 48.0 mmHg    Comment: CRITICAL RESULT CALLED TO, READ BACK BY AND VERIFIED WITH: J CURETON,RN 0114 08/30/2020 WILDERK    pO2, Arterial 110 (H) 83.0 - 108.0 mmHg   Bicarbonate 29.1 (H) 20.0 - 28.0 mmol/L   Acid-Base Excess 2.4 (H) 0.0 - 2.0 mmol/L   O2 Saturation 98.0 %   Patient temperature 37.4    Collection site LEFT RADIAL    Drawn by COLLECTED BY RT     Comment: LILLIAN H   Sample type ARTERIAL DRAW    Allens test (pass/fail) PASS PASS    Comment: Performed at [facility], [address], [place], [place] [address]  SARS CORONAVIRUS 2 (TAT 6-24 HRS) Nasopharyngeal Nasopharyngeal Swab     Status: None   Collection Time: 09/02/20  3:20 AM   Specimen: Nasopharyngeal Swab  Result Value Ref Range   SARS Coronavirus 2 NEGATIVE NEGATIVE    Comment:  (NOTE) SARS-CoV-2 target nucleic acids are NOT DETECTED.  The SARS-CoV-2 RNA is generally detectable in upper and lower respiratory specimens during the acute phase of infection. Negative results do not preclude SARS-CoV-2 infection, do not rule out co-infections with other pathogens, and should not be used as the sole basis for treatment or other patient management decisions. Negative results must be combined with clinical observations, patient history, and epidemiological information. The expected result is Negative.  Fact Sheet for Patients: HairSlick.no  Fact Sheet for Healthcare Providers: quierodirigir.com  This test is not yet approved or cleared by the [place] FDA and  has been authorized for detection and/or diagnosis of SARS-CoV-2 by FDA under an Emergency Use Authorization (EUA). This EUA will remain  in effect (meaning this test can be used) for the duration of the COVID-19 declaration under Se ction 564(b)(1) of the Act, 21 U.S.C. section 360bbb-3(b)(1), unless the authorization is terminated or revoked sooner.  Performed at [facility], [address], [place], [place] [address]   Glucose, capillary     Status: Abnormal   Collection Time: 09/02/20  3:29 AM  Result Value Ref Range   Glucose-Capillary 162 (H) 70 - 99 mg/dL    Comment: Glucose reference range applies only to samples taken after fasting for at  least 8 hours.  Blood gas, arterial     Status: Abnormal   Collection Time: 09/02/20  4:30 AM  Result Value Ref Range   FIO2 40.00    pH, Arterial 7.252 (L) 7.350 - 7.450   pCO2 arterial 65.4 (HH) 32.0 - 48.0 mmHg    Comment: CRITICAL RESULT CALLED TO, READ BACK BY AND VERIFIED WITH: J CURETON,RN 09/02/2020 0446 WILDERK    pO2, Arterial 413 (H) 83.0 - 108.0 mmHg   Bicarbonate 27.8 20.0 - 28.0 mmol/L   Acid-Base Excess 1.3 0.0 - 2.0 mmol/L   O2 Saturation 99.8 %   Patient temperature 37.0     Collection site LEFT RADIAL    Drawn by COLLECTED BY RT     Comment: LILLIAN H   Sample type ARTERIAL DRAW    Allens test (pass/fail) PASS PASS    Comment: Performed at [facility], [address] [place], [place] [address]  Ammonia     Status: Abnormal   Collection Time: 09/02/20  5:15 AM  Result Value Ref Range   Ammonia 52 (H) 9 - 35 umol/L    Comment: Performed at [facility], [address], [place], [place] [address]  Glucose, capillary     Status: Abnormal   Collection Time: 09/02/20  8:51 AM  Result Value Ref Range   Glucose-Capillary 175 (H) 70 - 99 mg/dL    Comment: Glucose reference range applies only to samples taken after fasting for at least 8 hours.    CT HEAD WO CONTRAST  Result Date: 09/02/2020 CLINICAL DATA:  65 year old female with altered mental status and fall. EXAM: CT HEAD WITHOUT CONTRAST TECHNIQUE: Contiguous axial images were obtained from the base of the skull through the vertex without intravenous contrast. COMPARISON:  [facility] Brain MRI 04/01/2010. Head CT 05/23/2014. FINDINGS: Brain: Cerebral volume is not significantly changed since 2016. No midline shift, ventriculomegaly, mass effect, evidence of mass lesion, intracranial hemorrhage or evidence of cortically based acute infarction. Hypodensity in the bilateral anterior limb white matter internal capsules has progressed since 2016. Mild associated heterogeneity in the left thalamus is new. But mild for age subcortical white matter hypodensity elsewhere. No cortical encephalomalacia identified. Small dystrophic calcification left lentiform. Vascular: No suspicious intracranial vascular hyperdensity. Skull: Intact.  No acute osseous abnormality identified. Sinuses/Orbits: Trace fluid in the hyperplastic left sphenoid sinus is chronic. Other visualized paranasal sinuses and mastoids are clear. Other: No orbit or scalp soft tissue injury identified. IMPRESSION: 1. No acute intracranial  abnormality or acute traumatic injury identified. 2. 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 Electronically Signed   By: Odessa Fleming M.D.   On: 09/02/2020 04:32    Review of Systems  HENT:  Negative for ear discharge, ear pain, hearing loss and tinnitus.   Eyes:  Negative for photophobia and pain.  Respiratory:  Negative for cough and shortness of breath.   Cardiovascular:  Negative for chest pain.  Gastrointestinal:  Negative for abdominal pain, nausea and vomiting.  Genitourinary:  Negative for dysuria, flank pain, frequency and urgency.  Musculoskeletal:  Positive for arthralgias (Right knee). Negative for back pain, myalgias and neck pain.  Neurological:  Negative for dizziness and headaches.  Hematological:  Does not bruise/bleed easily.  Psychiatric/Behavioral:  The patient is not nervous/anxious.   Blood pressure (!) 148/66, pulse 84, temperature 98.7 F (37.1 C), temperature source Axillary, resp. rate (!) 22, weight (!) 196 kg, SpO2 100 %.  Physical Exam Constitutional:      General: She is not in acute distress.    Appearance: She is well-developed. She is not diaphoretic.  HENT:     Head: Normocephalic and atraumatic.  Eyes:     General: No scleral icterus.       Right eye: No discharge.        Left eye: No discharge.     Conjunctiva/sclera: Conjunctivae normal.  Cardiovascular:     Rate and Rhythm: Normal rate and regular rhythm.  Pulmonary:     Effort: Pulmonary effort is normal. No respiratory distress.  Musculoskeletal:     Cervical back: Normal range of motion.     Comments: RLE No traumatic wounds, ecchymosis, or rash  Long leg splint in place  Sens DPN, SPN, TN intact  Motor EHL 5/5  Cap refill <2s, No significant edema  Skin:    General: Skin is warm and dry.  Neurological:     Mental Status: She is alert.  Psychiatric:        Mood and Affect: Mood normal.        Behavior: Behavior normal.     Assessment/Plan: Right distal femur fx -- Plan ORIF Multiple medical problems including hypertension, asthma, type 2 diabetes, and GERD -- per primary service    Freeman Caldron, PA-C Orthopedic Surgery [phone] 09/02/2020, 10:08 AM

## 2020-09-02 NOTE — H&P (Signed)
History and Physical    Rebecca Kemp TLX:[id number] DOB: 1955-10-23 DOA: 09/01/2020  PCP: No primary care provider on file. Patient coming from: [facility]  Chief Complaint: Fall  HPI: Rebecca Kemp is a 65 y.o. female with medical history significant of hypertension, asthma, type 2 diabetes, GERD presented to [facility] ED via EMS for evaluation of right leg injury and inability to ambulate after a mechanical fall at a grocery store.  Hypertensive on arrival to the ED, remainder of vital signs stable.  Labs showing WBC 8.0, hemoglobin 13.7, hematocrit 40.6, platelet count 266K.  Sodium 141, potassium 4.4, chloride 105, bicarb 31, BUN 15, creatinine 0.8, glucose 141.  X-ray showing comminuted, impacted, displaced fracture of the distal femur extending to the knee joint.  CT showing acute distal femur fracture with appearance most compatible with C3.1 fracture under the AO classification system and also showing hemorrhagic prepatellar bursitis.  Patient was seen by orthopedics at [facility] who recommended transfer to a tertiary care center for trauma specialist.  Patient was placed in a long leg splint with 45 degrees of knee flexion.  ED physician spoke to Dr. Eulah Pont with orthopedics at [facility] who agreed to see the patient in consultation.  Patient had also complained of left rib pain, left hand pain, and right hip pain.  X-rays of left ribs and chest, left hand, and right hip/pelvis negative for acute finding.  Per signout from accepting physician Dr. Sheppard Penton, ED had also reported altered mental status and elevated CO2 levels for which patient was started on BiPAP.  Patient is confused and somnolent.  Very slow to respond to questions.  States she was pushing her grocery cart and somehow fell.  She thinks she hit her head.  Complaining of pain in her right leg.  No additional history could be obtained from her.  Review of Systems:  All systems reviewed and apart from history of  presenting illness, are negative.  Past medical history: See HPI.  Surgical history: Cholecystectomy, hernia surgery, hysterectomy  Social history: Denies alcohol, tobacco, or drug use.  Family history: Hypertension, diabetes, cancer, stroke, cardiac disorders  Prior to Admission medications   Not on File    Physical Exam: Vitals:   09/02/20 0015 09/02/20 0025 09/02/20 0325  BP: (!) 144/64  (!) 159/61  Pulse: 94  87  Resp: 15  15  Temp: 99.4 F (37.4 C)  98.6 F (37 C)  TempSrc: Oral  Oral  SpO2: 92%  100%  Weight:  (!) 196 kg     Physical Exam Constitutional:      General: She is not in acute distress. HENT:     Head: Normocephalic and atraumatic.  Eyes:     Extraocular Movements: Extraocular movements intact.     Conjunctiva/sclera: Conjunctivae normal.  Cardiovascular:     Rate and Rhythm: Normal rate and regular rhythm.     Pulses: Normal pulses.  Pulmonary:     Effort: Pulmonary effort is normal. No respiratory distress.     Breath sounds: Normal breath sounds. No wheezing or rales.  Abdominal:     General: Bowel sounds are normal.     Palpations: Abdomen is soft.     Tenderness: There is no abdominal tenderness. There is no guarding.  Musculoskeletal:     Cervical back: Normal range of motion and neck supple.     Comments: Right lower extremity in long-leg splint  Skin:    General: Skin is warm and dry.  Neurological:     Mental Status: She is alert.     Comments: Somnolent but easily arousable Very slow to respond to questions Oriented to self and knows the year is 2022.  Not oriented to place. Moving all extremities on command except right leg which is in a long splint.     Labs on Admission: I have personally reviewed following labs and imaging studies  CBC: No results for input(s): WBC, NEUTROABS, HGB, HCT, MCV, PLT in the last 168 hours. Basic Metabolic Panel: No results for input(s): NA, K, CL, CO2, GLUCOSE, BUN, CREATININE, CALCIUM, MG,  PHOS in the last 168 hours. GFR: CrCl cannot be calculated (No successful lab value found.). Liver Function Tests: No results for input(s): AST, ALT, ALKPHOS, BILITOT, PROT, ALBUMIN in the last 168 hours. No results for input(s): LIPASE, AMYLASE in the last 168 hours. No results for input(s): AMMONIA in the last 168 hours. Coagulation Profile: No results for input(s): INR, PROTIME in the last 168 hours. Cardiac Enzymes: No results for input(s): CKTOTAL, CKMB, CKMBINDEX, TROPONINI in the last 168 hours. BNP (last 3 results) No results for input(s): PROBNP in the last 8760 hours. HbA1C: No results for input(s): HGBA1C in the last 72 hours. CBG: Recent Labs  Lab 09/02/20 0329  GLUCAP 162*   Lipid Profile: No results for input(s): CHOL, HDL, LDLCALC, TRIG, CHOLHDL, LDLDIRECT in the last 72 hours. Thyroid Function Tests: No results for input(s): TSH, T4TOTAL, FREET4, T3FREE, THYROIDAB in the last 72 hours. Anemia Panel: No results for input(s): VITAMINB12, FOLATE, FERRITIN, TIBC, IRON, RETICCTPCT in the last 72 hours. Urine analysis: No results found for: COLORURINE, APPEARANCEUR, LABSPEC, PHURINE, GLUCOSEU, HGBUR, BILIRUBINUR, KETONESUR, PROTEINUR, UROBILINOGEN, NITRITE, LEUKOCYTESUR  Radiological Exams on Admission: No results found.  Assessment/Plan Principal Problem:   Femur fracture (HCC) Active Problems:   Acute respiratory failure (HCC)   Encephalopathy   Essential hypertension   Type 2 diabetes mellitus (HCC)   Displaced right distal femur fracture and hemorrhagic prepatellar bursitis secondary to a mechanical fall X-ray showing comminuted, impacted, displaced fracture of the distal femur extending to the knee joint.  CT showing acute distal femur fracture with appearance most compatible with C3.1 fracture under the AO classification system and also showing hemorrhagic prepatellar bursitis.  Sent here from [place] to be evaluated by an orthopedic trauma  specialist. -Long-leg splint with 45 degrees of knee flexion placed by orthopedics at [facility].  Continue pain management.  Keep n.p.o. I spoke to Dr. Aundria Rud on-call for orthopedics tonight, will consult in the morning.  Acute hypoxemic hypercapnic respiratory failure with encephalopathy Lungs clear on exam.  Chest x-[facility] done at [facility] negative for acute finding. Patient had altered mental status with elevated CO2 levels at [facility] and was initially placed on BiPAP.  She arrived [facility] on 4 L supplemental oxygen and was drowsy.  Stat ABG showing pH 7.23, PCO2 71, PO2 110.  Noted to be desatting to as low as 87% on room air.  Suspect this is due to administration of opiates for acute severe pain, she was given multiple doses of fentanyl and Dilaudid at [place].  PE less likely to be a cause of respiratory failure as she is not tachycardic and was not hypoxic at the time of presentation to [facility] ED.  Hypoglycemia is not contributing to encephalopathy as her blood glucose is 162. -Continue BiPAP and repeat ABG in a few hours.  Check ammonia level.  Stat head CT ordered given encephalopathy and concern for possible  head injury from fall.  Hypertension Currently stable. -Resume home medications after pharmacy med rec is done.  Asthma Stable.  No signs of acute exacerbation. -Resume home medications after pharmacy med rec is done.  Type 2 diabetes -Pharmacy med rec pending.  Check A1c.  Order sliding scale insulin sensitive every 4 hours as patient is currently n.p.o.  DVT prophylaxis: SCDs Code Status: Full code Family Communication: No family available at this time. Disposition Plan: Status is: Inpatient  Remains inpatient appropriate because:Inpatient level of care appropriate due to severity of illness and needs surgery for distal femur fracture.  Dispo: The patient is from: Home              Anticipated d/c is to: Home              Patient currently is not medically  stable to d/c.   Difficult to place patient No  Level of care: Progressive  The medical decision making on this patient was of high complexity and the patient is at high risk for clinical deterioration, therefore this is a level 3 visit.  John Giovanni MD [facility]  If 7PM-7AM, please contact night-coverage [email or web address]  09/02/2020, 3:58 AM

## 2020-09-03 ENCOUNTER — Inpatient Hospital Stay (HOSPITAL_COMMUNITY): Payer: Medicare HMO

## 2020-09-03 ENCOUNTER — Inpatient Hospital Stay (HOSPITAL_COMMUNITY): Payer: Medicare HMO | Admitting: Anesthesiology

## 2020-09-03 ENCOUNTER — Encounter (HOSPITAL_COMMUNITY): Admission: AD | Disposition: A | Discharge status: Home Health | Payer: Self-pay | Attending: Internal Medicine

## 2020-09-03 ENCOUNTER — Encounter (HOSPITAL_COMMUNITY): Payer: Self-pay | Admitting: Internal Medicine

## 2020-09-03 DIAGNOSIS — J9602 Acute respiratory failure with hypercapnia: Secondary | ICD-10-CM

## 2020-09-03 HISTORY — PX: ORIF FEMUR FRACTURE: SHX2119

## 2020-09-03 LAB — CBC WITH DIFFERENTIAL/PLATELET
Abs Immature Granulocytes: 0.03 10*3/uL (ref 0.00–0.07)
Basophils Absolute: 0 10*3/uL (ref 0.0–0.1)
Basophils Relative: 0 %
Eosinophils Absolute: 0.1 10*3/uL (ref 0.0–0.5)
Eosinophils Relative: 1 %
HCT: 32.4 % — ABNORMAL LOW (ref 36.0–46.0)
Hemoglobin: 10.2 g/dL — ABNORMAL LOW (ref 12.0–15.0)
Immature Granulocytes: 0 %
Lymphocytes Relative: 19 %
Lymphs Abs: 1.7 10*3/uL (ref 0.7–4.0)
MCH: 30.5 pg (ref 26.0–34.0)
MCHC: 31.5 g/dL (ref 30.0–36.0)
MCV: 97 fL (ref 80.0–100.0)
Monocytes Absolute: 0.9 10*3/uL (ref 0.1–1.0)
Monocytes Relative: 10 %
Neutro Abs: 6.2 10*3/uL (ref 1.7–7.7)
Neutrophils Relative %: 70 %
Platelets: 209 10*3/uL (ref 150–400)
RBC: 3.34 MIL/uL — ABNORMAL LOW (ref 3.87–5.11)
RDW: 13.2 % (ref 11.5–15.5)
WBC: 8.9 10*3/uL (ref 4.0–10.5)
nRBC: 0 % (ref 0.0–0.2)

## 2020-09-03 LAB — COMPREHENSIVE METABOLIC PANEL
ALT: 13 U/L (ref 0–44)
AST: 13 U/L — ABNORMAL LOW (ref 15–41)
Albumin: 2.5 g/dL — ABNORMAL LOW (ref 3.5–5.0)
Alkaline Phosphatase: 74 U/L (ref 38–126)
Anion gap: 6 (ref 5–15)
BUN: 38 mg/dL — ABNORMAL HIGH (ref 8–23)
CO2: 31 mmol/L (ref 22–32)
Calcium: 9.3 mg/dL (ref 8.9–10.3)
Chloride: 103 mmol/L (ref 98–111)
Creatinine, Ser: 1.84 mg/dL — ABNORMAL HIGH (ref 0.44–1.00)
GFR, Estimated: 30 mL/min — ABNORMAL LOW (ref >60)
Glucose, Bld: 147 mg/dL — ABNORMAL HIGH (ref 70–99)
Potassium: 4.5 mmol/L (ref 3.5–5.1)
Sodium: 140 mmol/L (ref 135–145)
Total Bilirubin: 0.8 mg/dL (ref 0.3–1.2)
Total Protein: 5.7 g/dL — ABNORMAL LOW (ref 6.5–8.1)

## 2020-09-03 LAB — HEMOGLOBIN A1C
Hgb A1c MFr Bld: 6.8 % — ABNORMAL HIGH (ref 4.8–5.6)
Mean Plasma Glucose: 148 mg/dL

## 2020-09-03 LAB — BLOOD GAS, ARTERIAL
Acid-Base Excess: 5.4 mmol/L — ABNORMAL HIGH (ref 0.0–2.0)
Bicarbonate: 30.7 mmol/L — ABNORMAL HIGH (ref 20.0–28.0)
Drawn by: 336832
FIO2: 21
O2 Saturation: 85.3 %
Patient temperature: 37
pCO2 arterial: 56.6 mmHg — ABNORMAL HIGH (ref 32.0–48.0)
pH, Arterial: 7.354 (ref 7.350–7.450)
pO2, Arterial: 51.6 mmHg — ABNORMAL LOW (ref 83.0–108.0)

## 2020-09-03 LAB — GLUCOSE, CAPILLARY
Glucose-Capillary: 117 mg/dL — ABNORMAL HIGH (ref 70–99)
Glucose-Capillary: 136 mg/dL — ABNORMAL HIGH (ref 70–99)
Glucose-Capillary: 145 mg/dL — ABNORMAL HIGH (ref 70–99)
Glucose-Capillary: 151 mg/dL — ABNORMAL HIGH (ref 70–99)
Glucose-Capillary: 156 mg/dL — ABNORMAL HIGH (ref 70–99)
Glucose-Capillary: 157 mg/dL — ABNORMAL HIGH (ref 70–99)
Glucose-Capillary: 164 mg/dL — ABNORMAL HIGH (ref 70–99)
Glucose-Capillary: 180 mg/dL — ABNORMAL HIGH (ref 70–99)

## 2020-09-03 LAB — PROTIME-INR
INR: 1.2 (ref 0.8–1.2)
Prothrombin Time: 15.5 seconds — ABNORMAL HIGH (ref 11.4–15.2)

## 2020-09-03 LAB — CBC
HCT: 31.1 % — ABNORMAL LOW (ref 36.0–46.0)
Hemoglobin: 9.9 g/dL — ABNORMAL LOW (ref 12.0–15.0)
MCH: 30.5 pg (ref 26.0–34.0)
MCHC: 31.8 g/dL (ref 30.0–36.0)
MCV: 95.7 fL (ref 80.0–100.0)
Platelets: 196 10*3/uL (ref 150–400)
RBC: 3.25 MIL/uL — ABNORMAL LOW (ref 3.87–5.11)
RDW: 12.9 % (ref 11.5–15.5)
WBC: 10.4 10*3/uL (ref 4.0–10.5)
nRBC: 0 % (ref 0.0–0.2)

## 2020-09-03 LAB — VITAMIN D 25 HYDROXY (VIT D DEFICIENCY, FRACTURES): Vit D, 25-Hydroxy: 20.64 ng/mL — ABNORMAL LOW (ref 30–100)

## 2020-09-03 LAB — CREATININE, SERUM
Creatinine, Ser: 1.43 mg/dL — ABNORMAL HIGH (ref 0.44–1.00)
GFR, Estimated: 41 mL/min — ABNORMAL LOW (ref >60)

## 2020-09-03 SURGERY — OPEN REDUCTION INTERNAL FIXATION (ORIF) DISTAL FEMUR FRACTURE
Anesthesia: General | Laterality: Right

## 2020-09-03 MED ORDER — HYDROMORPHONE HCL 1 MG/ML IJ SOLN
1.0000 mg | Freq: Once | INTRAMUSCULAR | Status: AC
Start: 2020-09-03 — End: 2020-09-03
  Administered 2020-09-03: 1 mg via INTRAVENOUS
  Filled 2020-09-03: qty 1

## 2020-09-03 MED ORDER — LACTATED RINGERS IV SOLN: INTRAVENOUS | Status: DC

## 2020-09-03 MED ORDER — DOCUSATE SODIUM 100 MG PO CAPS
100.0000 mg | ORAL_CAPSULE | Freq: Two times a day (BID) | ORAL | Status: DC
  Administered 2020-09-03 – 2020-09-15 (×15): 100 mg via ORAL
  Filled 2020-09-03 (×20): qty 1

## 2020-09-03 MED ORDER — KETAMINE HCL 50 MG/5ML IJ SOSY
PREFILLED_SYRINGE | INTRAMUSCULAR | Status: AC
  Filled 2020-09-03: qty 5

## 2020-09-03 MED ORDER — ORAL CARE MOUTH RINSE: 15.0000 mL | Freq: Once | OROMUCOSAL | Status: AC

## 2020-09-03 MED ORDER — ONDANSETRON HCL 4 MG PO TABS: 4.0000 mg | ORAL_TABLET | Freq: Four times a day (QID) | ORAL | Status: DC | PRN

## 2020-09-03 MED ORDER — CHLORHEXIDINE GLUCONATE 0.12 % MT SOLN
OROMUCOSAL | Status: AC
  Administered 2020-09-03: 15 mL via OROMUCOSAL
  Filled 2020-09-03: qty 15

## 2020-09-03 MED ORDER — ESCITALOPRAM OXALATE 20 MG PO TABS
20.0000 mg | ORAL_TABLET | Freq: Every day | ORAL | Status: DC
  Administered 2020-09-03 – 2020-09-05 (×2): 20 mg via ORAL
  Filled 2020-09-03 (×3): qty 1

## 2020-09-03 MED ORDER — ESMOLOL HCL 100 MG/10ML IV SOLN
INTRAVENOUS | Status: AC
  Filled 2020-09-03: qty 10

## 2020-09-03 MED ORDER — TIZANIDINE HCL 4 MG PO TABS
4.0000 mg | ORAL_TABLET | Freq: Three times a day (TID) | ORAL | Status: DC | PRN
  Administered 2020-09-05: 4 mg via ORAL
  Filled 2020-09-03: qty 1

## 2020-09-03 MED ORDER — LIDOCAINE HCL (CARDIAC) PF 100 MG/5ML IV SOSY
PREFILLED_SYRINGE | INTRAVENOUS | Status: DC | PRN
  Administered 2020-09-03: 40 mg via INTRAVENOUS

## 2020-09-03 MED ORDER — FENTANYL CITRATE (PF) 100 MCG/2ML IJ SOLN
INTRAMUSCULAR | Status: AC
  Filled 2020-09-03: qty 2

## 2020-09-03 MED ORDER — LACTULOSE 10 GM/15ML PO SOLN
20.0000 g | Freq: Two times a day (BID) | ORAL | Status: DC
  Administered 2020-09-03 – 2020-09-15 (×20): 20 g via ORAL
  Filled 2020-09-03 (×21): qty 30

## 2020-09-03 MED ORDER — PHENYLEPHRINE HCL-NACL 10-0.9 MG/250ML-% IV SOLN
INTRAVENOUS | Status: AC
  Filled 2020-09-03: qty 250

## 2020-09-03 MED ORDER — ONDANSETRON HCL 4 MG/2ML IJ SOLN: 4.0000 mg | Freq: Four times a day (QID) | INTRAMUSCULAR | Status: DC | PRN

## 2020-09-03 MED ORDER — METOCLOPRAMIDE HCL 5 MG PO TABS: 5.0000 mg | ORAL_TABLET | Freq: Three times a day (TID) | ORAL | Status: DC | PRN

## 2020-09-03 MED ORDER — ENOXAPARIN SODIUM 40 MG/0.4ML IJ SOSY
40.0000 mg | PREFILLED_SYRINGE | INTRAMUSCULAR | Status: DC
  Administered 2020-09-04 – 2020-09-09 (×6): 40 mg via SUBCUTANEOUS
  Filled 2020-09-03 (×6): qty 0.4

## 2020-09-03 MED ORDER — OXYCODONE-ACETAMINOPHEN 5-325 MG PO TABS
1.0000 | ORAL_TABLET | ORAL | Status: DC | PRN
  Administered 2020-09-03 – 2020-09-14 (×39): 2 via ORAL
  Filled 2020-09-03 (×40): qty 2

## 2020-09-03 MED ORDER — DIPHENHYDRAMINE HCL 12.5 MG/5ML PO ELIX
12.5000 mg | ORAL_SOLUTION | ORAL | Status: DC | PRN
Start: 2020-09-03 — End: 2020-09-15
  Filled 2020-09-03: qty 10

## 2020-09-03 MED ORDER — FENTANYL CITRATE (PF) 100 MCG/2ML IJ SOLN
25.0000 ug | INTRAMUSCULAR | Status: DC | PRN
  Administered 2020-09-03 (×2): 50 ug via INTRAVENOUS
  Administered 2020-09-03 (×2): 25 ug via INTRAVENOUS

## 2020-09-03 MED ORDER — TRANEXAMIC ACID-NACL 1000-0.7 MG/100ML-% IV SOLN
1000.0000 mg | Freq: Once | INTRAVENOUS | Status: AC
  Administered 2020-09-03: 1000 mg via INTRAVENOUS
  Filled 2020-09-03 (×2): qty 100

## 2020-09-03 MED ORDER — KETAMINE HCL 10 MG/ML IJ SOLN
INTRAMUSCULAR | Status: DC | PRN
  Administered 2020-09-03: 20 mg via INTRAVENOUS

## 2020-09-03 MED ORDER — METOCLOPRAMIDE HCL 5 MG/ML IJ SOLN: 5.0000 mg | Freq: Three times a day (TID) | INTRAMUSCULAR | Status: DC | PRN

## 2020-09-03 MED ORDER — ALBUTEROL SULFATE (2.5 MG/3ML) 0.083% IN NEBU: 2.5000 mg | INHALATION_SOLUTION | RESPIRATORY_TRACT | Status: DC | PRN

## 2020-09-03 MED ORDER — ESMOLOL HCL 100 MG/10ML IV SOLN
INTRAVENOUS | Status: DC | PRN
  Administered 2020-09-03: 20 mg via INTRAVENOUS

## 2020-09-03 MED ORDER — ROCURONIUM BROMIDE 100 MG/10ML IV SOLN
INTRAVENOUS | Status: DC | PRN
  Administered 2020-09-03: 50 mg via INTRAVENOUS
  Administered 2020-09-03: 20 mg via INTRAVENOUS

## 2020-09-03 MED ORDER — CEFAZOLIN SODIUM-DEXTROSE 2-4 GM/100ML-% IV SOLN
2.0000 g | Freq: Three times a day (TID) | INTRAVENOUS | Status: AC
  Administered 2020-09-03 – 2020-09-04 (×3): 2 g via INTRAVENOUS
  Filled 2020-09-03 (×3): qty 100

## 2020-09-03 MED ORDER — IRBESARTAN 300 MG PO TABS
300.0000 mg | ORAL_TABLET | Freq: Every day | ORAL | Status: DC
  Administered 2020-09-03 – 2020-09-15 (×12): 300 mg via ORAL
  Filled 2020-09-03 (×12): qty 1

## 2020-09-03 MED ORDER — HYDROMORPHONE HCL 1 MG/ML IJ SOLN
0.5000 mg | INTRAMUSCULAR | Status: DC | PRN
  Administered 2020-09-03 – 2020-09-04 (×3): 1 mg via INTRAVENOUS
  Filled 2020-09-03 (×3): qty 1

## 2020-09-03 MED ORDER — 0.9 % SODIUM CHLORIDE (POUR BTL) OPTIME
TOPICAL | Status: DC | PRN
  Administered 2020-09-03: 1000 mL

## 2020-09-03 MED ORDER — SUGAMMADEX SODIUM 500 MG/5ML IV SOLN
INTRAVENOUS | Status: DC | PRN
  Administered 2020-09-03: 200 mg via INTRAVENOUS

## 2020-09-03 MED ORDER — AMISULPRIDE (ANTIEMETIC) 5 MG/2ML IV SOLN
10.0000 mg | Freq: Once | INTRAVENOUS | Status: DC | PRN
Start: 2020-09-03 — End: 2020-09-03

## 2020-09-03 MED ORDER — SODIUM CHLORIDE 0.9 % IV SOLN: INTRAVENOUS | Status: DC

## 2020-09-03 MED ORDER — PROPOFOL 10 MG/ML IV BOLUS
INTRAVENOUS | Status: DC | PRN
  Administered 2020-09-03: 30 mg via INTRAVENOUS
  Administered 2020-09-03: 200 mg via INTRAVENOUS

## 2020-09-03 MED ORDER — ONDANSETRON HCL 4 MG/2ML IJ SOLN
4.0000 mg | Freq: Four times a day (QID) | INTRAMUSCULAR | Status: DC | PRN
  Administered 2020-09-03 (×2): 4 mg via INTRAVENOUS
  Filled 2020-09-03: qty 2

## 2020-09-03 MED ORDER — CHLORHEXIDINE GLUCONATE 0.12 % MT SOLN
15.0000 mL | Freq: Once | OROMUCOSAL | Status: AC
Start: 2020-09-03 — End: 2020-09-03

## 2020-09-03 MED ORDER — VANCOMYCIN HCL 1000 MG IV SOLR
INTRAVENOUS | Status: DC | PRN
  Administered 2020-09-03: 1000 mg

## 2020-09-03 MED ORDER — OLMESARTAN MEDOXOMIL-HCTZ 40-25 MG PO TABS: 1.0000 | ORAL_TABLET | Freq: Every day | ORAL | Status: DC

## 2020-09-03 MED ORDER — FENTANYL CITRATE (PF) 250 MCG/5ML IJ SOLN
INTRAMUSCULAR | Status: AC
  Filled 2020-09-03: qty 5

## 2020-09-03 MED ORDER — POLYETHYLENE GLYCOL 3350 17 G PO PACK
17.0000 g | PACK | Freq: Every day | ORAL | Status: DC | PRN
  Filled 2020-09-03: qty 1

## 2020-09-03 MED ORDER — FENTANYL CITRATE (PF) 100 MCG/2ML IJ SOLN
INTRAMUSCULAR | Status: DC | PRN
  Administered 2020-09-03: 50 ug via INTRAVENOUS

## 2020-09-03 MED ORDER — VANCOMYCIN HCL 1000 MG IV SOLR
INTRAVENOUS | Status: AC
  Filled 2020-09-03: qty 1000

## 2020-09-03 MED ORDER — HYDROCHLOROTHIAZIDE 25 MG PO TABS
25.0000 mg | ORAL_TABLET | Freq: Every day | ORAL | Status: DC
  Administered 2020-09-03 – 2020-09-06 (×3): 25 mg via ORAL
  Filled 2020-09-03 (×3): qty 1

## 2020-09-03 MED ORDER — GABAPENTIN 400 MG PO CAPS
400.0000 mg | ORAL_CAPSULE | Freq: Three times a day (TID) | ORAL | Status: DC
  Administered 2020-09-03 – 2020-09-06 (×6): 400 mg via ORAL
  Filled 2020-09-03 (×6): qty 1

## 2020-09-03 SURGICAL SUPPLY — 67 items
BAG COUNTER SPONGE SURGICOUNT (BAG) ×2 IMPLANT
BIT DRILL 4.3 (BIT) ×2
BIT DRILL 4.3X300MM (BIT) IMPLANT
BIT DRILL LONG 3.3 (BIT) ×1 IMPLANT
BIT DRILL QC 3.3X195 (BIT) ×1 IMPLANT
BLADE CLIPPER SURG (BLADE) IMPLANT
BNDG COHESIVE 6X5 TAN STRL LF (GAUZE/BANDAGES/DRESSINGS) ×1 IMPLANT
BNDG ELASTIC 6X10 VLCR STRL LF (GAUZE/BANDAGES/DRESSINGS) ×2 IMPLANT
BRUSH SCRUB EZ PLAIN DRY (MISCELLANEOUS) ×4 IMPLANT
CANISTER SUCT 3000ML PPV (MISCELLANEOUS) ×2 IMPLANT
CAP LOCK NCB (Cap) ×7 IMPLANT
CHLORAPREP W/TINT 26 (MISCELLANEOUS) ×4 IMPLANT
COVER SURGICAL LIGHT HANDLE (MISCELLANEOUS) ×2 IMPLANT
DERMABOND ADVANCED (GAUZE/BANDAGES/DRESSINGS) ×2
DERMABOND ADVANCED .7 DNX12 (GAUZE/BANDAGES/DRESSINGS) IMPLANT
DRAPE C-ARM 42X72 X-RAY (DRAPES) ×2 IMPLANT
DRAPE C-ARMOR (DRAPES) ×2 IMPLANT
DRAPE HALF SHEET 40X57 (DRAPES) ×4 IMPLANT
DRAPE ORTHO SPLIT 77X108 STRL (DRAPES) ×4
DRAPE SURG 17X23 STRL (DRAPES) ×2 IMPLANT
DRAPE SURG ORHT 6 SPLT 77X108 (DRAPES) ×2 IMPLANT
DRAPE U-SHAPE 47X51 STRL (DRAPES) ×2 IMPLANT
DRSG ADAPTIC 3X8 NADH LF (GAUZE/BANDAGES/DRESSINGS) IMPLANT
DRSG MEPILEX BORDER 4X12 (GAUZE/BANDAGES/DRESSINGS) ×1 IMPLANT
DRSG MEPILEX BORDER 4X4 (GAUZE/BANDAGES/DRESSINGS) IMPLANT
DRSG MEPILEX BORDER 4X8 (GAUZE/BANDAGES/DRESSINGS) ×1 IMPLANT
DRSG PAD ABDOMINAL 8X10 ST (GAUZE/BANDAGES/DRESSINGS) ×3 IMPLANT
ELECT REM PT RETURN 9FT ADLT (ELECTROSURGICAL) ×2
ELECTRODE REM PT RTRN 9FT ADLT (ELECTROSURGICAL) ×1 IMPLANT
GAUZE SPONGE 4X4 12PLY STRL (GAUZE/BANDAGES/DRESSINGS) ×2 IMPLANT
GLOVE SURG ENC MOIS LTX SZ6.5 (GLOVE) ×6 IMPLANT
GLOVE SURG ENC MOIS LTX SZ7.5 (GLOVE) ×8 IMPLANT
GLOVE SURG UNDER POLY LF SZ6.5 (GLOVE) ×2 IMPLANT
GLOVE SURG UNDER POLY LF SZ7.5 (GLOVE) ×2 IMPLANT
GOWN STRL REUS W/ TWL LRG LVL3 (GOWN DISPOSABLE) ×3 IMPLANT
GOWN STRL REUS W/TWL LRG LVL3 (GOWN DISPOSABLE) ×6
K-WIRE 2.0 (WIRE) ×8
K-WIRE FXSTD 280X2XNS SS (WIRE) ×4
KIT BASIN OR (CUSTOM PROCEDURE TRAY) ×2 IMPLANT
KIT TURNOVER KIT B (KITS) ×2 IMPLANT
KWIRE FXSTD 280X2XNS SS (WIRE) IMPLANT
NCB locking cap IMPLANT
NS IRRIG 1000ML POUR BTL (IV SOLUTION) ×2 IMPLANT
PACK TOTAL JOINT (CUSTOM PROCEDURE TRAY) ×2 IMPLANT
PAD ARMBOARD 7.5X6 YLW CONV (MISCELLANEOUS) ×4 IMPLANT
PAD CAST 4YDX4 CTTN HI CHSV (CAST SUPPLIES) ×1 IMPLANT
PADDING CAST COTTON 4X4 STRL (CAST SUPPLIES) ×2
PADDING CAST COTTON 6X4 STRL (CAST SUPPLIES) ×2 IMPLANT
PLATE FEM DIST NCB PP 278MM (Plate) ×1 IMPLANT
SCREW CORT NCB SELFTAP 5.0X42 (Screw) ×2 IMPLANT
SCREW CORTICAL NCB 5.0X90MM (Screw) ×3 IMPLANT
SCREW NCB 4.0MX42M (Screw) ×1 IMPLANT
SCREW NCB 5.0X85MM (Screw) ×2 IMPLANT
SPONGE T-LAP 18X18 ~~LOC~~+RFID (SPONGE) IMPLANT
STAPLER VISISTAT 35W (STAPLE) ×2 IMPLANT
SUCTION FRAZIER HANDLE 10FR (MISCELLANEOUS) ×2
SUCTION TUBE FRAZIER 10FR DISP (MISCELLANEOUS) ×1 IMPLANT
SUT ETHILON 3 0 PS 1 (SUTURE) ×4 IMPLANT
SUT VIC AB 0 CT1 27 (SUTURE)
SUT VIC AB 0 CT1 27XBRD ANBCTR (SUTURE) IMPLANT
SUT VIC AB 1 CT1 27 (SUTURE)
SUT VIC AB 1 CT1 27XBRD ANBCTR (SUTURE) IMPLANT
SUT VIC AB 2-0 CT1 27 (SUTURE) ×4
SUT VIC AB 2-0 CT1 TAPERPNT 27 (SUTURE) ×2 IMPLANT
TOWEL GREEN STERILE (TOWEL DISPOSABLE) ×4 IMPLANT
TRAY FOLEY MTR SLVR 16FR STAT (SET/KITS/TRAYS/PACK) IMPLANT
WATER STERILE IRR 1000ML POUR (IV SOLUTION) ×2 IMPLANT

## 2020-09-03 NOTE — Anesthesia Procedure Notes (Signed)
Procedure Name: Intubation Date/Time: 09/03/2020 3:33 PM Performed by: Jonna Munro, CRNA Pre-anesthesia Checklist: Patient identified, Patient being monitored, Timeout performed, Emergency Drugs available and Suction available Patient Re-evaluated:Patient Re-evaluated prior to induction Oxygen Delivery Method: Circle System Utilized Preoxygenation: Pre-oxygenation with 100% oxygen Induction Type: IV induction Ventilation: Mask ventilation without difficulty Laryngoscope Size: Mac, Glidescope and 4 Grade View: Grade II Tube type: Oral Tube size: 7.5 mm Number of attempts: 1 Airway Equipment and Method: stylet Placement Confirmation: ETT inserted through vocal cords under direct vision, positive ETCO2 and breath sounds checked- equal and bilateral Secured at: 21 cm Tube secured with: Tape Dental Injury: Teeth and Oropharynx as per pre-operative assessment

## 2020-09-03 NOTE — Progress Notes (Signed)
Routine ABG obtained and sent to lab. Pt placed back on bipap due to pt appearing sleepy at this time.

## 2020-09-03 NOTE — Progress Notes (Signed)
PROGRESS NOTE    Rebecca Kemp  AQT:[id number] DOB: 1955-10-24 DOA: 09/01/2020 PCP: Pcp, No    No chief complaint on file.   Brief Narrative:  Rebecca Kemp is a 65 y.o. female with medical history significant of hypertension, asthma, type 2 diabetes, GERD presented to [facility] ED via EMS for evaluation of right leg injury and inability to ambulate after a mechanical fall at a grocery store. Pt seen and examined at bedside.   Patient was seen by orthopedics at [facility] who recommended transfer to a tertiary care center for trauma specialist.  Patient was placed in a long leg splint with 45 degrees of knee flexion.  ED physician spoke to Dr. Eulah Pont with orthopedics at [facility] who agreed to see the patient in consultation.  Patient had also complained of left rib pain, left hand pain, and right hip pain.  X-rays of left ribs and chest, left hand, and right hip/pelvis negative for acute finding.  pt was started on BIPAP as she was found to be altered mental status and elevated co2  on ABG.   Orthopedics consulted, she is scheduled for surgery today.  Assessment & Plan:   Principal Problem:   Femur fracture (HCC) Active Problems:   Acute respiratory failure (HCC)   Encephalopathy   Essential hypertension   Type 2 diabetes mellitus (HCC)   Acute metabolic encephalopathy:  Probably secondary to hypercarbic respiratory failure.  Improving, but not back to baseline yet.  Get repeat ABG.    Acute femur fracture post fall Orthopedics consulted, plan for OR later today.  Pai control.  PT evaluation in am.   Hypertension:  Not well controlled.  Restart home meds.     Type 2 DM;  CBG (last 3)  Recent Labs    09/03/20 0041 09/03/20 0324 09/03/20 0803  GLUCAP 156* 145* 157*   RESUME SSI.  Hemoglobin A1c is 6.8%   Acute hypercarbic respiratory failure  Initially requiring BIPAP, currently on RA.    Stage 3b CKD:  Creatinine around 1.8.  Continue to  monitor.     Diabetic neuropathy:  Resume gabapentin.    DVT prophylaxis: Lovenox Code Status: full code Family Communication: none at bedside.  Disposition:   Status is: Inpatient  Remains inpatient appropriate because:Ongoing diagnostic testing needed not appropriate for outpatient work up and Unsafe d/c plan  Dispo: The patient is from: Home              Anticipated d/c is to:  pending              Patient currently is not medically stable to d/c.   Difficult to place patient No       Consultants:  orthopedics  Procedures: none  Antimicrobials:  Antibiotics Given (last 72 hours)     None         Subjective: Pt confused, but comfortable.   Objective: Vitals:   09/03/20 0325 09/03/20 0400 09/03/20 0550 09/03/20 0759  BP: (!) 160/70 (!) 144/65  (!) 168/78  Pulse: 88 83  92  Resp: 20 20  18   Temp: 99.2 F (37.3 C)   98.9 F (37.2 C)  TempSrc: Axillary   Oral  SpO2: 98% 95% 100% 100%  Weight:        Intake/Output Summary (Last 24 hours) at 09/03/2020 1148 Last data filed at 09/03/2020 0651 Gross per 24 hour  Intake 360 ml  Output 1100 ml  Net -740 ml   09/05/2020  09/02/20 0025  Weight: (!) 196 kg    Examination:  General exam: Appears calm and comfortable  Respiratory system: Clear to auscultation. Respiratory effort normal. Cardiovascular system: S1 & S2 heard, RRR. No JVD,  Gastrointestinal system: Abdomen is nondistended, soft and nontender. Normal bowel sounds heard. Central nervous system: Alert and oriented to person and place only.  Extremities: Symmetric 5 x 5 power. Skin: No rashes, lesions or ulcers Psychiatry: Mood & affect appropriate.     Data Reviewed: I have personally reviewed following labs and imaging studies  CBC: Recent Labs  Lab 09/03/20 0054  WBC 8.9  NEUTROABS 6.2  HGB 10.2*  HCT 32.4*  MCV 97.0  PLT 209    Basic Metabolic Panel: Recent Labs  Lab 09/03/20 0054  NA 140  K 4.5  CL 103  CO2 31   GLUCOSE 147*  BUN 38*  CREATININE 1.84*  CALCIUM 9.3    GFR: CrCl cannot be calculated (Unknown ideal weight.).  Liver Function Tests: Recent Labs  Lab 09/03/20 0054  AST 13*  ALT 13  ALKPHOS 74  BILITOT 0.8  PROT 5.7*  ALBUMIN 2.5*    CBG: Recent Labs  Lab 09/02/20 2119 09/02/20 2215 09/03/20 0041 09/03/20 0324 09/03/20 0803  GLUCAP 176* 149* 156* 145* 157*     Recent Results (from the past 240 hour(s))  SARS CORONAVIRUS 2 (TAT 6-24 HRS) Nasopharyngeal Nasopharyngeal Swab     Status: None   Collection Time: 09/02/20  3:20 AM   Specimen: Nasopharyngeal Swab  Result Value Ref Range Status   SARS Coronavirus 2 NEGATIVE NEGATIVE Final    Comment: (NOTE) SARS-CoV-2 target nucleic acids are NOT DETECTED.  The SARS-CoV-2 RNA is generally detectable in upper and lower respiratory specimens during the acute phase of infection. Negative results do not preclude SARS-CoV-2 infection, do not rule out co-infections with other pathogens, and should not be used as the sole basis for treatment or other patient management decisions. Negative results must be combined with clinical observations, patient history, and epidemiological information. The expected result is Negative.  Fact Sheet for Patients: HairSlick.no  Fact Sheet for Healthcare Providers: quierodirigir.com  This test is not yet approved or cleared by the [place] FDA and  has been authorized for detection and/or diagnosis of SARS-CoV-2 by FDA under an Emergency Use Authorization (EUA). This EUA will remain  in effect (meaning this test can be used) for the duration of the COVID-19 declaration under Se ction 564(b)(1) of the Act, 21 U.S.C. section 360bbb-3(b)(1), unless the authorization is terminated or revoked sooner.  Performed at [facility], [address] [place], [place] [address]   Surgical PCR screen     Status: None    Collection Time: 09/02/20  4:40 PM   Specimen: Nasal Mucosa; Nasal Swab  Result Value Ref Range Status   MRSA, PCR NEGATIVE NEGATIVE Final   Staphylococcus aureus NEGATIVE NEGATIVE Final    Comment: (NOTE) The Xpert SA Assay (FDA approved for NASAL specimens in patients 68 years of age and older), is one component of a comprehensive surveillance program. It is not intended to diagnose infection nor to guide or monitor treatment. Performed at [facility], [address] [place], [place] [address]          Radiology Studies: CT HEAD WO CONTRAST  Result Date: 09/02/2020 CLINICAL DATA:  65 year old female with altered mental status and fall. EXAM: CT HEAD WITHOUT CONTRAST TECHNIQUE: Contiguous axial images were obtained from the base  of the skull through the vertex without intravenous contrast. COMPARISON:  [facility] Brain MRI 04/01/2010. Head CT 05/23/2014. FINDINGS: Brain: Cerebral volume is not significantly changed since 2016. No midline shift, ventriculomegaly, mass effect, evidence of mass lesion, intracranial hemorrhage or evidence of cortically based acute infarction. Hypodensity in the bilateral anterior limb white matter internal capsules has progressed since 2016. Mild associated heterogeneity in the left thalamus is new. But mild for age subcortical white matter hypodensity elsewhere. No cortical encephalomalacia identified. Small dystrophic calcification left lentiform. Vascular: No suspicious intracranial vascular hyperdensity. Skull: Intact.  No acute osseous abnormality identified. Sinuses/Orbits: Trace fluid in the hyperplastic left sphenoid sinus is chronic. Other visualized paranasal sinuses and mastoids are clear. Other: No orbit or scalp soft tissue injury identified. IMPRESSION: 1. No acute intracranial abnormality or acute traumatic injury identified. 2. 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 Electronically  Signed   By: Odessa Fleming M.D.   On: 09/02/2020 04:32        Scheduled Meds:  insulin aspart  0-9 Units Subcutaneous Q4H   povidone-iodine  2 application Topical Once   Continuous Infusions:   ceFAZolin (ANCEF) IV       LOS: 2 days       Kathlen Mody, MD [facility]   To contact the attending provider between 7A-7P or the covering provider during after hours 7P-7A, please log into the web site [email or web address] and access using universal [place] password for that web site. If you do not have the password, please call the hospital operator.  09/03/2020, 11:48 AM

## 2020-09-03 NOTE — Interval H&P Note (Signed)
History and Physical Interval Note:  09/03/2020 3:00 PM  Rebecca Kemp  has presented today for surgery, with the diagnosis of distal femur fracture.  The various methods of treatment have been discussed with the patient and family. After consideration of risks, benefits and other options for treatment, the patient has consented to  Procedure(s): OPEN REDUCTION INTERNAL FIXATION (ORIF) DISTAL FEMUR FRACTURE (Right) as a surgical intervention.  The patient's history has been reviewed, patient examined, no change in status, stable for surgery.  I have reviewed the patient's chart and labs.  Questions were answered to the patient's satisfaction.     Caryn Bee P Vincenzina Jagoda

## 2020-09-03 NOTE — Anesthesia Preprocedure Evaluation (Addendum)
Anesthesia Evaluation  Patient identified by MRN, date of birth, ID band Patient confused    Reviewed: Allergy & Precautions, NPO status , Patient's Chart, lab work & pertinent test results  Airway Mallampati: III  TM Distance: >3 FB Neck ROM: Full    Dental  (+) Dental Advisory Given   Pulmonary shortness of breath, asthma ,    breath sounds clear to auscultation       Cardiovascular hypertension, Pt. on medications  Rhythm:Regular Rate:Normal     Neuro/Psych negative neurological ROS     GI/Hepatic negative GI ROS, Neg liver ROS,   Endo/Other  diabetes  Renal/GU negative Renal ROS     Musculoskeletal   Abdominal   Peds  Hematology  (+) anemia ,   Anesthesia Other Findings   Reproductive/Obstetrics                            Lab Results  Component Value Date   WBC 8.9 09/03/2020   HGB 10.2 (L) 09/03/2020   HCT 32.4 (L) 09/03/2020   MCV 97.0 09/03/2020   PLT 209 09/03/2020   Lab Results  Component Value Date   CREATININE 1.84 (H) 09/03/2020   BUN 38 (H) 09/03/2020   NA 140 09/03/2020   K 4.5 09/03/2020   CL 103 09/03/2020   CO2 31 09/03/2020    Anesthesia Physical Anesthesia Plan  ASA: 4  Anesthesia Plan: General   Post-op Pain Management:    Induction: Intravenous  PONV Risk Score and Plan: 3 and Dexamethasone, Ondansetron and Treatment may vary due to age or medical condition  Airway Management Planned: Oral ETT  Additional Equipment:   Intra-op Plan:   Post-operative Plan: Possible Post-op intubation/ventilation  Informed Consent: I have reviewed the patients History and Physical, chart, labs and discussed the procedure including the risks, benefits and alternatives for the proposed anesthesia with the patient or authorized representative who has indicated his/her understanding and acceptance.     Dental advisory given and Consent reviewed with POA  Plan  Discussed with: CRNA  Anesthesia Plan Comments: (Pt with lots of sedative meds given and hypercarbia on recent ABG's. Improved with continued BiPAP. At risk for post-op intubation and discussed with POA.)       Anesthesia Quick Evaluation

## 2020-09-03 NOTE — Transfer of Care (Signed)
Immediate Anesthesia Transfer of Care Note  Patient: Rebecca Kemp  Procedure(s) Performed: OPEN REDUCTION INTERNAL FIXATION (ORIF) DISTAL FEMUR FRACTURE (Right)  Patient Location: PACU  Anesthesia Type:General  Level of Consciousness: awake  Airway & Oxygen Therapy: Patient Spontanous Breathing and Patient connected to face mask oxygen  Post-op Assessment: Report given to RN  Post vital signs: Reviewed and stable  Last Vitals:  Vitals Value Taken Time  BP 125/102 09/03/20 1715  Temp    Pulse 85 09/03/20 1716  Resp 17 09/03/20 1716  SpO2 96 % 09/03/20 1716  Vitals shown include unvalidated device data.  Last Pain:  Vitals:   09/03/20 1441  TempSrc:   PainSc: 0-No pain         Complications: No notable events documented.

## 2020-09-03 NOTE — Op Note (Signed)
Orthopaedic Surgery Operative Note (CSN: [id number] ) Date of Surgery: 09/03/2020  Admit Date: 09/01/2020   Diagnoses: Pre-Op Diagnoses: Right supracondylar distal femur fracture with intracondylar distal femur fracture  Post-Op Diagnosis: Same  Procedures: CPT 27513-Open reduction internal fixation of right intra-articular distal femur fracture  Surgeons : Primary: Roby Lofts, MD  Assistant: Ulyses Southward, PA-C  Location: OR 3   Anesthesia:General  Antibiotics: Ancef 3g preop with 1 gm vancomycin powder placed topically  Tourniquet time:None    Estimated Blood Loss:100 mL   Complications:None  Specimens:None  Implants: Implant Name Type Inv. Item Serial No. Manufacturer Lot No. LRB No. Used Action  SCREW CORTICAL NCB 5.0X90MM - [id number] Screw SCREW CORTICAL NCB 5.0X90MM  [employer](ORTH,TRAU,[employer],SG) [id number] Right 1 Implanted  SCREW CORTICAL NCB 5.0X90MM - [id number] Screw SCREW CORTICAL NCB 5.0X90MM  [employer](ORTH,TRAU,BIO,SG) [id number] Right 1 Implanted  SCREW CORTICAL NCB 5.0X90MM - [id number] Screw SCREW CORTICAL NCB 5.0X90MM  [employer](ORTH,TRAU,BIO,SG) [id number] Right 1 Implanted  PLATE FEM DIST NCB PP - [id number] Plate PLATE FEM DIST NCB PP  [employer](ORTH,TRAU,BIO,SG)  Right 1 Implanted  SCREW NCB 5.0X85MM - [id number] Screw SCREW NCB 5.0X85MM  [employer](ORTH,TRAU,BIO,SG)  Right 2 Implanted  SCREW CORT NCB SELFTAP 5.0X42 - [id number] Screw SCREW CORT NCB SELFTAP 5.0X42  [employer](ORTH,TRAU,BIO,SG)  Right 2 Implanted  SCREW NCB 4.0MX42M - [id number] Screw SCREW NCB 4.0MX42M  [employer](ORTH,TRAU,BIO,SG)  Right 1 Implanted     Indications for Surgery: 65 year old female who sustained a right intra-articular distal femur fracture.  Due to the unstable nature of her injury I recommended proceeding with open reduction internal fixation.  Risks and benefits were discussed with the patient's daughter.  Risks include but not limited to bleeding,  infection, malunion, nonunion, hardware failure, hardware irritation, nerve or blood vessel injury, posttraumatic arthritis, knee stiffness, DVT, even the possibility anesthetic complications.  They agreed to proceed with surgery and consent was obtained.  Operative Findings: Open reduction internal fixation of right intra-articular distal femur fracture using [employer] NCB distal femoral locking plate  Procedure: The patient was identified in the preoperative holding area. Consent was confirmed with the patient and their family and all questions were answered. The operative extremity was marked after confirmation with the patient. she was then brought back to the operating room by our anesthesia colleagues.  She was carefully transferred over to a radiolucent flat top table.  She was placed under general anesthetic.  A bump was placed under her operative hip.  The right lower extremity was then prepped and draped in usual sterile fashion.  A timeout was performed to verify the patient, procedure, and the extremity.  Preoperative antibiotics were dosed.  Fluoroscopic imaging was obtained to show the unstable nature of her injury.  Her hip and knee were flexed over a triangle.  I made a lateral parapatellar incision and carried it down through skin and subcutaneous tissue.  I incised through the IT band.  I released the soft tissue off the lateral condyle of the femur.  I used retractors to visualize the intra-articular split.  I manipulated the medial condyle to get anatomic reduction and proceeded to hold it provisionally with 2.0 mm K wires.  The K wires were advanced and brought through the medial side and brought flush to the lateral condyle to prevent interference with the plate.  I then align the metaphyseal fracture and I used a 12 hole [employer] NCB plate and slid it submuscularly along the lateral cortex of  the femur using the targeting arm.  I held the distal portion of plate in place  with a 2.0 mm K wire.  A percutaneous 3.3 mm drill bit was used to align the proximal portion of the plate.  I then placed 5.0 millimeter screws in the distal segment to bring the plate flush to bone.  I then percutaneously placed 5.0 millimeter screws to bring the proximal portion of the plate flush to bone and correct the coronal alignment.  I then remove the 3.3 mm drill bit and placed a 4.0 millimeter screw in the most proximal screw hole.  Locking caps were placed on the 5.0 millimeter screws and the targeting arm was removed.  I then placed a 5.0 millimeter screws distally in the articular segment.  A total of 5 screws were placed in total.  Locking caps were placed on all of these distal screws.  Final fluoroscopic imaging was obtained.  The incision was copiously irrigated.  A gram of vancomycin powder was placed into the incision.  A layered closure of 0 Vicryl, 2-0 Vicryl and 3-0 Monocryl was used to close the skin.  Dermabond was used to seal the skin.  Sterile dressings were placed.  The patient was then awoken from anesthesia and taken to the PACU in stable condition.   Post Op Plan/Instructions: Will be touchdown weightbearing to right lower extremity.  She will receive postoperative Ancef.  She will receive Lovenox for DVT prophylaxis.  We will have her mobilize with physical and Occupational Therapy.  I was present and performed the entire surgery.  Ulyses Southward, PA-C did assist me throughout the case. An assistant was necessary given the difficulty in approach, maintenance of reduction and ability to instrument the fracture.   Truitt Merle, MD [facility]

## 2020-09-04 ENCOUNTER — Encounter (HOSPITAL_COMMUNITY): Payer: Self-pay | Admitting: Student

## 2020-09-04 LAB — GLUCOSE, CAPILLARY
Glucose-Capillary: 141 mg/dL — ABNORMAL HIGH (ref 70–99)
Glucose-Capillary: 152 mg/dL — ABNORMAL HIGH (ref 70–99)
Glucose-Capillary: 152 mg/dL — ABNORMAL HIGH (ref 70–99)
Glucose-Capillary: 151 mg/dL — ABNORMAL HIGH (ref 70–99)
Glucose-Capillary: 141 mg/dL — ABNORMAL HIGH (ref 70–99)

## 2020-09-04 LAB — IRON AND TIBC
Iron: 47 ug/dL (ref 28–170)
Saturation Ratios: 17 % (ref 10.4–31.8)
TIBC: 269 ug/dL (ref 250–450)
UIBC: 222 ug/dL

## 2020-09-04 LAB — TSH: TSH: 1.226 u[IU]/mL (ref 0.350–4.500)

## 2020-09-04 LAB — CBC
HCT: 28.4 % — ABNORMAL LOW (ref 36.0–46.0)
Hemoglobin: 9.1 g/dL — ABNORMAL LOW (ref 12.0–15.0)
MCH: 30.8 pg (ref 26.0–34.0)
MCHC: 32 g/dL (ref 30.0–36.0)
MCV: 96.3 fL (ref 80.0–100.0)
Platelets: 201 10*3/uL (ref 150–400)
RBC: 2.95 MIL/uL — ABNORMAL LOW (ref 3.87–5.11)
RDW: 13 % (ref 11.5–15.5)
WBC: 8.7 10*3/uL (ref 4.0–10.5)
nRBC: 0 % (ref 0.0–0.2)

## 2020-09-04 LAB — VITAMIN B12: Vitamin B-12: 135 pg/mL — ABNORMAL LOW (ref 180–914)

## 2020-09-04 LAB — BASIC METABOLIC PANEL
Anion gap: 4 — ABNORMAL LOW (ref 5–15)
BUN: 33 mg/dL — ABNORMAL HIGH (ref 8–23)
CO2: 33 mmol/L — ABNORMAL HIGH (ref 22–32)
Calcium: 9 mg/dL (ref 8.9–10.3)
Chloride: 106 mmol/L (ref 98–111)
Creatinine, Ser: 1.32 mg/dL — ABNORMAL HIGH (ref 0.44–1.00)
GFR, Estimated: 45 mL/min — ABNORMAL LOW (ref >60)
Glucose, Bld: 145 mg/dL — ABNORMAL HIGH (ref 70–99)
Potassium: 4.4 mmol/L (ref 3.5–5.1)
Sodium: 143 mmol/L (ref 135–145)

## 2020-09-04 LAB — FERRITIN: Ferritin: 246 ng/mL (ref 11–307)

## 2020-09-04 LAB — AMMONIA: Ammonia: 31 umol/L (ref 9–35)

## 2020-09-04 MED ORDER — VITAMIN D3 25 MCG PO TABS: 2000.0000 [IU] | ORAL_TABLET | Freq: Every day | ORAL | 2 refills | Status: AC

## 2020-09-04 MED ORDER — ENOXAPARIN SODIUM 40 MG/0.4ML IJ SOSY: 40.0000 mg | PREFILLED_SYRINGE | INTRAMUSCULAR | 0 refills | Status: DC

## 2020-09-04 MED ORDER — HYDROMORPHONE HCL 1 MG/ML IJ SOLN
0.5000 mg | INTRAMUSCULAR | Status: DC | PRN
  Administered 2020-09-04 (×2): 0.5 mg via INTRAVENOUS
  Filled 2020-09-04 (×2): qty 1

## 2020-09-04 MED ORDER — OXYCODONE-ACETAMINOPHEN 5-325 MG PO TABS: 1.0000 | ORAL_TABLET | Freq: Four times a day (QID) | ORAL | 0 refills | Status: DC | PRN

## 2020-09-04 MED ORDER — VITAMIN D 25 MCG (1000 UNIT) PO TABS
2000.0000 [IU] | ORAL_TABLET | Freq: Every day | ORAL | Status: DC
  Administered 2020-09-05 – 2020-09-15 (×11): 2000 [IU] via ORAL
  Filled 2020-09-04 (×11): qty 2

## 2020-09-04 NOTE — Anesthesia Postprocedure Evaluation (Signed)
Anesthesia Post Note  Patient: Rebecca Kemp  Procedure(s) Performed: OPEN REDUCTION INTERNAL FIXATION (ORIF) DISTAL FEMUR FRACTURE (Right)     Patient location during evaluation: PACU Anesthesia Type: General Level of consciousness: awake and alert Pain management: pain level controlled Vital Signs Assessment: post-procedure vital signs reviewed and stable Respiratory status: spontaneous breathing, nonlabored ventilation, respiratory function stable and patient connected to nasal cannula oxygen Cardiovascular status: blood pressure returned to baseline and stable Postop Assessment: no apparent nausea or vomiting Anesthetic complications: no   No notable events documented.  Last Vitals:  Vitals:   09/04/20 0817 09/04/20 0825  BP:  (!) 121/57  Pulse: 85 86  Resp: 19 18  Temp:  37.2 C  SpO2: 96% 97%    Last Pain:  Vitals:   09/04/20 0825  TempSrc: Oral  PainSc:                  [name]

## 2020-09-04 NOTE — Progress Notes (Signed)
OT Cancellation Note  Patient Details Name: Rebecca Kemp MRN: [id number] DOB: 1955-08-23   Cancelled Treatment:    Reason Eval/Treat Not Completed: Fatigue/lethargy limiting ability to participate;Patient's level of consciousness Pt recently received dilaudid and on entry, pt awakens to voice but could not answer questions or follow directions. Pt only moaning. Will re-attempt later today as appropriate to engage pt in therapy eval.   Lorre Munroe 09/04/2020, 8:16 AM

## 2020-09-04 NOTE — Care Management Important Message (Signed)
Important Message  Patient Details  Name: Rebecca Kemp MRN: [id number] Date of Birth: 05-28-1955   Medicare Important Message Given:  Yes     Dorena Bodo 09/04/2020, 3:14 PM

## 2020-09-04 NOTE — Evaluation (Addendum)
Physical Therapy Evaluation Patient Details Name: Rebecca Kemp MRN: [id number] DOB: 02/01/1956 Today's Date: 09/04/2020   History of Present Illness  pt is a 65 y/o female admitted 6/28 after suffering a mechanical fall at the grocery store.  Pt s/p ORIF of right intra-articular distal femur fracture.  Pt presently experiencing some encephalopathy, MRI pending 7/1.  PMHx:  HTN, DM2,  Clinical Impression  Pt admitted with/for fall, R distal intra-articular femur fx, s/p ORIF.  Pt's ability to participate limited by encephalopathy and she needed 2 person total assist overall as of the evaluation.  Pt currently limited functionally due to the problems listed. ( See problems list.)   Pt will benefit from PT to maximize function and safety in order to get ready for next venue listed below.     Follow Up Recommendations SNF;Supervision/Assistance - 24 hour    Equipment Recommendations  Other (comment) (TBD)    Recommendations for Other Services       Precautions / Restrictions Precautions Precautions: Fall Restrictions Weight Bearing Restrictions: Yes RLE Weight Bearing: Touchdown weight bearing      Mobility  Bed Mobility Overal bed mobility: Needs Assistance Bed Mobility: Rolling;Sidelying to Sit;Sit to Supine Rolling: Max assist;+2 for physical assistance Sidelying to sit: Total assist;+2 for physical assistance   Sit to supine: Total assist;+2 for physical assistance   General bed mobility comments: pt was unable to focus on task past any intensity of pain caused by moving the R LE.  R LE was supported and pt was brought to EOB with significant physical assist with pt wholly resistant throughout due to encephalopathy and pain.    Transfers                 General transfer comment: unable due to not safe.  Ambulation/Gait             General Gait Details: pt not able  Stairs            Wheelchair Mobility    Modified Rankin (Stroke Patients Only)        Balance Overall balance assessment: Needs assistance Sitting-balance support: Single extremity supported;Bilateral upper extremity supported;Feet supported;Feet unsupported Sitting balance-Leahy Scale: Poor Sitting balance - Comments: Extensive time spent getting pt into a safe sitting position at EOB without full success due to pt's resistance.  Pt was able to somewhat relax propped on L elbow, but needed full blocking from front and guarding/blocking from the back. Postural control: Posterior lean;Left lateral lean                                   Pertinent Vitals/Pain Pain Assessment: Faces Faces Pain Scale: Hurts whole lot Pain Location: R LE Pain Descriptors / Indicators: Grimacing;Guarding;Moaning Pain Intervention(s): Limited activity within patient's tolerance;Monitored during session;Premedicated before session    Home Living Family/patient expects to be discharged to:: Private residence Living Arrangements: Spouse/significant other;Children Available Help at Discharge: Family             Additional Comments: TBA, family called to attain information without success.    Prior Function           Comments: TBD     Hand Dominance        Extremity/Trunk Assessment   Upper Extremity Assessment Upper Extremity Assessment: Defer to OT evaluation    Lower Extremity Assessment Lower Extremity Assessment: RLE deficits/detail;LLE deficits/detail RLE Deficits / Details: painful, knee flexion  passively against gravity sitting to 50* RLE: Unable to fully assess due to pain RLE Coordination: decreased fine motor;decreased gross motor LLE Deficits / Details: use to guard, likely functional strength, but pt unable to coordinate movement due to encephalopathy LLE Coordination: decreased fine motor       Communication   Communication: No difficulties  Cognition Arousal/Alertness: Lethargic Behavior During Therapy: Restless  (encephalopathic) Overall Cognitive Status: Impaired/Different from baseline Area of Impairment: Orientation;Attention;Following commands;Safety/judgement;Awareness;Problem solving                 Orientation Level: Situation;Time;Place Current Attention Level: Focused   Following Commands: Follows one step commands inconsistently Safety/Judgement: Decreased awareness of safety;Decreased awareness of deficits Awareness: Intellectual Problem Solving: Slow processing;Decreased initiation;Difficulty sequencing;Requires verbal cues;Requires tactile cues        General Comments General comments (skin integrity, edema, etc.): vss overall.    Exercises     Assessment/Plan    PT Assessment Patient needs continued PT services  PT Problem List Decreased strength;Decreased activity tolerance;Decreased range of motion;Decreased balance;Decreased mobility;Decreased cognition;Decreased knowledge of use of DME;Decreased knowledge of precautions;Pain       PT Treatment Interventions DME instruction;Gait training;Functional mobility training;Therapeutic activities;Patient/family education;Therapeutic exercise    PT Goals (Current goals can be found in the Care Plan section)  Acute Rehab PT Goals Patient Stated Goal: pt unable PT Goal Formulation: Patient unable to participate in goal setting Time For Goal Achievement: 09/18/20 Potential to Achieve Goals: Fair    Frequency Min 4X/week   Barriers to discharge        Co-evaluation PT/OT/SLP Co-Evaluation/Treatment: Yes Reason for Co-Treatment: Complexity of the patient's impairments (multi-system involvement);For patient/therapist safety PT goals addressed during session: Mobility/safety with mobility         AM-PAC PT "6 Clicks" Mobility  Outcome Measure Help needed turning from your back to your side while in a flat bed without using bedrails?: Total Help needed moving from lying on your back to sitting on the side of a flat  bed without using bedrails?: Total Help needed moving to and from a bed to a chair (including a wheelchair)?: Total Help needed standing up from a chair using your arms (e.g., wheelchair or bedside chair)?: Total Help needed to walk in hospital room?: Total Help needed climbing 3-5 steps with a railing? : Total 6 Click Score: 6    End of Session   Activity Tolerance: Patient limited by pain (limited by encephalopathy) Patient left: in bed;with call bell/phone within reach;with bed alarm set (all 4 rails up) Nurse Communication: Mobility status PT Visit Diagnosis: Other abnormalities of gait and mobility (R26.89);Muscle weakness (generalized) (M62.81);Pain Pain - Right/Left: Right Pain - part of body: Leg    Time: 9983-3825 PT Time Calculation (min) (ACUTE ONLY): 24 min   Charges:   PT Evaluation $PT Eval Moderate Complexity: 1 Mod          09/04/2020  Jacinto Halim., PT Acute Rehabilitation Services [phone]  (pager) [phone]  (office)  Eliseo Gum Carisha Kantor 09/04/2020, 12:05 PM

## 2020-09-04 NOTE — Progress Notes (Signed)
PROGRESS NOTE    Rebecca Kemp  NFA:[id number] DOB: 01-16-1956 DOA: 09/01/2020 PCP: Pcp, No    No chief complaint on file.   Brief Narrative:  Rebecca Kemp is a 65 y.o. female with medical history significant of hypertension, asthma, type 2 diabetes, GERD presented to [facility] ED via EMS for evaluation of right leg injury and inability to ambulate after a mechanical fall at a grocery store. Pt seen and examined at bedside.   Patient was seen by orthopedics at [facility] who recommended transfer to a tertiary care center for trauma specialist.  Patient was placed in a long leg splint with 45 degrees of knee flexion.  ED physician spoke to Dr. Eulah Pont with orthopedics at [facility] who agreed to see the patient in consultation.  Patient had also complained of left rib pain, left hand pain, and right hip pain.  X-rays of left ribs and chest, left hand, and right hip/pelvis negative for acute finding.  pt was started on BIPAP as she was found to be altered mental status and elevated co2  on ABG.   Orthopedics consulted, she underwent -Open reduction internal fixation of right intra-articular distal femur fracture on 09/03/20/  Pt seen and examined, remained confused.  Assessment & Plan:   Principal Problem:   Femur fracture (HCC) Active Problems:   Acute respiratory failure (HCC)   Encephalopathy   Essential hypertension   Type 2 diabetes mellitus (HCC)   Acute metabolic encephalopathy:  Probably secondary to hypercarbic respiratory failure, pain meds. No focal deficits.  Improving, but not back to baseline yet.  ABG shows improvement in PH to 7.35, and improvement of pCO2 to 56.  Will obtain MRI of the brain without contrast for further evaluation.      Acute femur fracture post fall Orthopedics consulted, she underwent -Open reduction internal fixation of right intra-articular distal femur fracture on 09/03/20, therapy eval recommending SNF.  Pai control.      Hypertension:  BETTER controlled today.     Type 2 DM; non insulin dependent.  CBG (last 3)  Recent Labs    09/04/20 0411 09/04/20 0820 09/04/20 1156  GLUCAP 141* 152* 152*    RESUME SSI.  Hemoglobin A1c is 6.8%   Acute hypercarbic respiratory failure / OHS Initially requiring BIPAP, currently on RA.  Improvement in pco2,    Stage 3b CKD:  Creatinine on admission is 1.8, improved to 1.3.     Diabetic neuropathy:  Resume gabapentin.   Anemia:  ?  Chronic disease, normocytic anemia.  Anemia of acute blood loss from the surgery.  Hemoglobin on admission is 10, not sure what her baseline hemoglobin is.  Dropped to 9 today.  Continue to monitor.    There is no height or weight on file to calculate BMI.    DVT prophylaxis: Lovenox Code Status: full code Family Communication: none at bedside.  Disposition:   Status is: Inpatient  Remains inpatient appropriate because:Ongoing diagnostic testing needed not appropriate for outpatient work up and Unsafe d/c plan  Dispo: The patient is from: Home              Anticipated d/c is to:  pending              Patient currently is not medically stable to d/c.   Difficult to place patient No       Consultants:  orthopedics  Procedures: none  Antimicrobials:  Antibiotics Given (last 72 hours)     Date/Time  Action Medication Dose Rate   09/03/20 1512 Given   ceFAZolin (ANCEF) IVPB 3g/100 mL premix 3 g    09/03/20 1555 Given  [exp 03/2023]   vancomycin (VANCOCIN) powder 1,000 mg    09/03/20 2216 New Bag/Given   ceFAZolin (ANCEF) IVPB 2g/100 mL premix 2 g 200 mL/hr   09/04/20 1309 New Bag/Given   ceFAZolin (ANCEF) IVPB 2g/100 mL premix 2 g 200 mL/hr         Subjective: Pt able to tell me her name. .  Objective: Vitals:   09/04/20 0547 09/04/20 0817 09/04/20 0825 09/04/20 1157  BP:   (!) 121/57 137/80  Pulse:  85 86 81  Resp:  19 18 18   Temp:   98.9 F (37.2 C) 98.8 F (37.1 C)   TempSrc:   Oral Oral  SpO2: 96% 96% 97% 100%  Weight:        Intake/Output Summary (Last 24 hours) at 09/04/2020 1430 Last data filed at 09/04/2020 11/05/2020 Gross per 24 hour  Intake 1140 ml  Output 500 ml  Net 640 ml    Filed Weights   09/02/20 0025  Weight: (!) 196 kg    Examination:  General exam: obese, elderly lady, not in distress.  Respiratory system: diminished air entry at bases, on 2lit of [place] oxygen.  Cardiovascular system: S1 & S2 heard,RRR no JVD.  Gastrointestinal system: Abdomen is soft, non tender non distended bowel sounds wnl.  Central nervous system: alert and oriented to person only.  Extremities: no cyanosis.  Skin: No rashes.  Psychiatry: flat affect.     Data Reviewed: I have personally reviewed following labs and imaging studies  CBC: Recent Labs  Lab 09/03/20 0054 09/03/20 2027 09/04/20 0156  WBC 8.9 10.4 8.7  NEUTROABS 6.2  --   --   HGB 10.2* 9.9* 9.1*  HCT 32.4* 31.1* 28.4*  MCV 97.0 95.7 96.3  PLT 209 196 201     Basic Metabolic Panel: Recent Labs  Lab 09/03/20 0054 09/03/20 1927 09/04/20 0156  NA 140  --  143  K 4.5  --  4.4  CL 103  --  106  CO2 31  --  33*  GLUCOSE 147*  --  145*  BUN 38*  --  33*  CREATININE 1.84* 1.43* 1.32*  CALCIUM 9.3  --  9.0     GFR: CrCl cannot be calculated (Unknown ideal weight.).  Liver Function Tests: Recent Labs  Lab 09/03/20 0054  AST 13*  ALT 13  ALKPHOS 74  BILITOT 0.8  PROT 5.7*  ALBUMIN 2.5*     CBG: Recent Labs  Lab 09/03/20 2015 09/03/20 2351 09/04/20 0411 09/04/20 0820 09/04/20 1156  GLUCAP 180* 164* 141* 152* 152*      Recent Results (from the past 240 hour(s))  SARS CORONAVIRUS 2 (TAT 6-24 HRS) Nasopharyngeal Nasopharyngeal Swab     Status: None   Collection Time: 09/02/20  3:20 AM   Specimen: Nasopharyngeal Swab  Result Value Ref Range Status   SARS Coronavirus 2 NEGATIVE NEGATIVE Final    Comment: (NOTE) SARS-CoV-2 target nucleic acids are NOT  DETECTED.  The SARS-CoV-2 RNA is generally detectable in upper and lower respiratory specimens during the acute phase of infection. Negative results do not preclude SARS-CoV-2 infection, do not rule out co-infections with other pathogens, and should not be used as the sole basis for treatment or other patient management decisions. Negative results must be combined with clinical observations, patient history, and epidemiological information. The expected  result is Negative.  Fact Sheet for Patients: HairSlick.nohttps://www.fda.gov/media/138098/download  Fact Sheet for Healthcare Providers: quierodirigir.comhttps://www.fda.gov/media/138095/download  This test is not yet approved or cleared by the [place] FDA and  has been authorized for detection and/or diagnosis of SARS-CoV-2 by FDA under an Emergency Use Authorization (EUA). This EUA will remain  in effect (meaning this test can be used) for the duration of the COVID-19 declaration under Se ction 564(b)(1) of the Act, 21 U.S.C. section 360bbb-3(b)(1), unless the authorization is terminated or revoked sooner.  Performed at [facility], [address] [place], [place] [address]   Surgical PCR screen     Status: None   Collection Time: 09/02/20  4:40 PM   Specimen: Nasal Mucosa; Nasal Swab  Result Value Ref Range Status   MRSA, PCR NEGATIVE NEGATIVE Final   Staphylococcus aureus NEGATIVE NEGATIVE Final    Comment: (NOTE) The Xpert SA Assay (FDA approved for NASAL specimens in patients 64 years of age and older), is one component of a comprehensive surveillance program. It is not intended to diagnose infection nor to guide or monitor treatment. Performed at [facility], [address] [place], [place] [address]           Radiology Studies: DG C-Arm 1-60 Min  Result Date: 09/03/2020 CLINICAL DATA:  Surgery, elective. Additional history provided: Open reduction internal fixation (ORIF) distal femur fracture (right). Provided  fluoroscopy time 1 minutes, 20 seconds (10.86 mGy). EXAM: RIGHT FEMUR 2 VIEWS; DG C-ARM 1-60 MIN COMPARISON:  CT of the right lower extremity 08/31/2020. FINDINGS: Five intraoperative fluoroscopic images of the right femur are submitted. On the provided images, there are findings of interval ORIF of a known comminuted distal right femoral fracture utilizing a lateral plate and multiple interlocking screws. Persistent although improved displacement at the fracture sites. IMPRESSION: Five intraoperative fluoroscopic images of the right femur from right femoral ORIF, as described. Electronically Signed   By: Jackey LogeKyle  Golden DO   On: 09/03/2020 16:57   DG FEMUR, MIN 2 VIEWS RIGHT  Result Date: 09/03/2020 CLINICAL DATA:  Surgery, elective. Additional history provided: Open reduction internal fixation (ORIF) distal femur fracture (right). Provided fluoroscopy time 1 minutes, 20 seconds (10.86 mGy). EXAM: RIGHT FEMUR 2 VIEWS; DG C-ARM 1-60 MIN COMPARISON:  CT of the right lower extremity 08/31/2020. FINDINGS: Five intraoperative fluoroscopic images of the right femur are submitted. On the provided images, there are findings of interval ORIF of a known comminuted distal right femoral fracture utilizing a lateral plate and multiple interlocking screws. Persistent although improved displacement at the fracture sites. IMPRESSION: Five intraoperative fluoroscopic images of the right femur from right femoral ORIF, as described. Electronically Signed   By: Jackey LogeKyle  Golden DO   On: 09/03/2020 16:57   DG FEMUR PORT, MIN 2 VIEWS RIGHT  Result Date: 09/03/2020 CLINICAL DATA:  Post ORIF, right femur fracture EXAM: RIGHT FEMUR PORTABLE 2 VIEW COMPARISON:  CT 08/31/2020 intraoperative fluoroscopy 09/03/2020 FINDINGS: Patient is post ORIF of the comminuted distal femoral fracture with intra-articular extension. Improved alignment post placement of a lateral plate and screw fixation construct. Some residual cortical step-off noted along  the medial femoral metadiaphysis. Excreted postsurgical soft tissue changes including diffuse soft tissue swelling, soft tissue gas and pneumarthrosis. No acute hardware complication is evident. No unexpected retained foreign bodies. IMPRESSION: Post ORIF of a comminuted distal femoral fracture with intra-articular extension. Alignment as above. No acute complication or unexpected retained foreign bodies. See operative report for further details. Electronically  Signed   By: Kreg Shropshire M.D.   On: 09/03/2020 19:56        Scheduled Meds:  cholecalciferol  2,000 Units Oral Daily   docusate sodium  100 mg Oral BID   enoxaparin (LOVENOX) injection  40 mg Subcutaneous Q24H   escitalopram  20 mg Oral Daily   gabapentin  400 mg Oral TID   irbesartan  300 mg Oral Daily   And   hydrochlorothiazide  25 mg Oral Daily   insulin aspart  0-9 Units Subcutaneous Q4H   lactulose  20 g Oral BID   Continuous Infusions:  sodium chloride 75 mL/hr at 09/04/20 1045     LOS: 3 days       Kathlen Mody, MD [facility]   To contact the attending provider between 7A-7P or the covering provider during after hours 7P-7A, please log into the web site [email or web address] and access using universal [place] password for that web site. If you do not have the password, please call the hospital operator.  09/04/2020, 2:30 PM

## 2020-09-04 NOTE — Progress Notes (Signed)
Orthopaedic Trauma Progress Note  SUBJECTIVE: Drowsy this morning, just received dilaudid for pain. Keeps falling asleep throughout exam. No other complaints of note  OBJECTIVE:  Vitals:   09/04/20 0340 09/04/20 0547  BP: (!) 108/49   Pulse: 68   Resp: 17   Temp: 98.6 F (37 C)   SpO2: 96% 96%    General: Resting in bed, NAD Respiratory: No increased work of breathing.  RLE:: Dressing CDI. Tender about knee as expected. Endorses sensation to light touch over toes. Wiggles toes. Foot warm and well perfused. +DP pulse  IMAGING: Stable post op imaging.   LABS:  Results for orders placed or performed during the hospital encounter of 09/01/20 (from the past 24 hour(s))  Glucose, capillary     Status: Abnormal   Collection Time: 09/03/20  8:03 AM  Result Value Ref Range   Glucose-Capillary 157 (H) 70 - 99 mg/dL   Comment 1 Notify RN    Comment 2 Document in Chart   Blood gas, arterial     Status: Abnormal   Collection Time: 09/03/20 12:23 PM  Result Value Ref Range   FIO2 21.00    pH, Arterial 7.354 7.350 - 7.450   pCO2 arterial 56.6 (H) 32.0 - 48.0 mmHg   pO2, Arterial 51.6 (L) 83.0 - 108.0 mmHg   Bicarbonate 30.7 (H) 20.0 - 28.0 mmol/L   Acid-Base Excess 5.4 (H) 0.0 - 2.0 mmol/L   O2 Saturation 85.3 %   Patient temperature 37.0    Collection site LEFT RADIAL    Drawn by [id number]    Sample type ARTERIAL DRAW    Allens test (pass/fail) PASS PASS  Glucose, capillary     Status: Abnormal   Collection Time: 09/03/20 12:31 PM  Result Value Ref Range   Glucose-Capillary 151 (H) 70 - 99 mg/dL   Comment 1 Notify RN    Comment 2 Document in Chart   Glucose, capillary     Status: Abnormal   Collection Time: 09/03/20  2:09 PM  Result Value Ref Range   Glucose-Capillary 136 (H) 70 - 99 mg/dL   Comment 1 Notify RN    Comment 2 Document in Chart   Glucose, capillary     Status: Abnormal   Collection Time: 09/03/20  5:15 PM  Result Value Ref Range   Glucose-Capillary 117 (H) 70 -  99 mg/dL  VITAMIN D 25 Hydroxy (Vit-D Deficiency, Fractures)     Status: Abnormal   Collection Time: 09/03/20  7:27 PM  Result Value Ref Range   Vit D, 25-Hydroxy 20.64 (L) 30 - 100 ng/mL  Creatinine, serum     Status: Abnormal   Collection Time: 09/03/20  7:27 PM  Result Value Ref Range   Creatinine, Ser 1.43 (H) 0.44 - 1.00 mg/dL   GFR, Estimated 41 (L) >60 mL/min  Glucose, capillary     Status: Abnormal   Collection Time: 09/03/20  8:15 PM  Result Value Ref Range   Glucose-Capillary 180 (H) 70 - 99 mg/dL   Comment 1 Notify RN    Comment 2 Document in Chart   CBC     Status: Abnormal   Collection Time: 09/03/20  8:27 PM  Result Value Ref Range   WBC 10.4 4.0 - 10.5 K/uL   RBC 3.25 (L) 3.87 - 5.11 MIL/uL   Hemoglobin 9.9 (L) 12.0 - 15.0 g/dL   HCT 78.9 (L) 38.1 - 01.7 %   MCV 95.7 80.0 - 100.0 fL   MCH 30.5 26.0 -  34.0 pg   MCHC 31.8 30.0 - 36.0 g/dL   RDW 17.9 15.0 - 56.9 %   Platelets 196 150 - 400 K/uL   nRBC 0.0 0.0 - 0.2 %  Glucose, capillary     Status: Abnormal   Collection Time: 09/03/20 11:51 PM  Result Value Ref Range   Glucose-Capillary 164 (H) 70 - 99 mg/dL   Comment 1 Notify RN    Comment 2 Document in Chart   Basic metabolic panel     Status: Abnormal   Collection Time: 09/04/20  1:56 AM  Result Value Ref Range   Sodium 143 135 - 145 mmol/L   Potassium 4.4 3.5 - 5.1 mmol/L   Chloride 106 98 - 111 mmol/L   CO2 33 (H) 22 - 32 mmol/L   Glucose, Bld 145 (H) 70 - 99 mg/dL   BUN 33 (H) 8 - 23 mg/dL   Creatinine, Ser 7.94 (H) 0.44 - 1.00 mg/dL   Calcium 9.0 8.9 - 80.1 mg/dL   GFR, Estimated 45 (L) >60 mL/min   Anion gap 4 (L) 5 - 15  Ammonia     Status: None   Collection Time: 09/04/20  1:56 AM  Result Value Ref Range   Ammonia 31 9 - 35 umol/L  CBC     Status: Abnormal   Collection Time: 09/04/20  1:56 AM  Result Value Ref Range   WBC 8.7 4.0 - 10.5 K/uL   RBC 2.95 (L) 3.87 - 5.11 MIL/uL   Hemoglobin 9.1 (L) 12.0 - 15.0 g/dL   HCT 65.5 (L) 37.4 -  46.0 %   MCV 96.3 80.0 - 100.0 fL   MCH 30.8 26.0 - 34.0 pg   MCHC 32.0 30.0 - 36.0 g/dL   RDW 82.7 07.8 - 67.5 %   Platelets 201 150 - 400 K/uL   nRBC 0.0 0.0 - 0.2 %  Glucose, capillary     Status: Abnormal   Collection Time: 09/04/20  4:11 AM  Result Value Ref Range   Glucose-Capillary 141 (H) 70 - 99 mg/dL   Comment 1 Notify RN    Comment 2 Document in Chart     ASSESSMENT: Rebecca Kemp is a 65 y.o. female, 1 Day Post-Op s/p ORIF RIGHT DISTAL FEMUR FRACTURE  CV/Blood loss: Hgb 9.1 this AM.   PLAN: Weightbearing: TDWB RLE ROM: ok for unrestricted knee motion Incisional and dressing care: Reinforce dressings as needed  Showering: Bed bath for now. Ok to begin showering with assistance 09/07/20 if no drainage from incisions Orthopedic device(s): None  Pain management:  1. Tylenol 650 mg q 6 hours PRN 2. Percocet 5-325 q 4 hour PRN 3. Neurontin 400 mg TID 4. Dilaudid 0.5 mg q 4 hours PRN VTE prophylaxis: Lovenox, SCDs ID:  Ancef 2gm post op Foley/Lines:  No foley, KVO IVFs Impediments to Fracture Healing: Vit D level 20, start supplementation Dispo: PT/OT eval today, dispo pending. Will likely need SNF Follow - up plan: 2 weeks after d/c for repeat x-rays  Contact information:  Truitt Merle MD, Ulyses Southward PA-C. After hours and holidays please check [email or web address] for group call information for [facility]   Ketara Cavness A. Michaelyn Barter, PA-C [phone] (office) [email or web address]

## 2020-09-04 NOTE — OR Nursing (Signed)
The locking caps for the implanted screws are implants, not a supply as originally documented by staff.  Implants were corrected.

## 2020-09-04 NOTE — Progress Notes (Signed)
Called MRI to inquire about completing ordered imaging.  Assured it will be done today.  BiPap replaced and PRN pain meds given.  Pt now resting quietly with O2 sat at 100%.

## 2020-09-04 NOTE — Evaluation (Signed)
Occupational Therapy Evaluation Patient Details Name: Rebecca Kemp MRN: [id number] DOB: 1955/11/25 Today's Date: 09/04/2020    History of Present Illness pt is a 65 y/o female admitted 6/28 after suffering a mechanical fall at the grocery store.  Pt s/p ORIF of right intra-articular distal femur fracture.  Pt presently experiencing some encephalopathy, MRI pending 7/1.  PMHx:  HTN, DM2,   Clinical Impression   Pt presents with diagnoses above and deficits in cognition, strength, sitting balance and pain. Pt overall with inability to follow directions consistenly and often resistant to any movement. Pt overall Total A x 2 for all bed mobility attempts due to cognitive deficits. Pt unable to achieve proper sitting balance EOB and therefore, unable to safely attempt any transfers OOB. Pt requires overall Total A for ADLs, +2 for LB ADLs bed level. Recommend SNF at [place] for short term rehab as pt is likely well below functional baseline.     Follow Up Recommendations  SNF;Supervision/Assistance - 24 hour    Equipment Recommendations  Wheelchair (measurements OT);Wheelchair cushion (measurements OT);Hospital bed    Recommendations for Other Services       Precautions / Restrictions Precautions Precautions: Fall Restrictions Weight Bearing Restrictions: Yes RLE Weight Bearing: Touchdown weight bearing      Mobility Bed Mobility Overal bed mobility: Needs Assistance Bed Mobility: Rolling;Sidelying to Sit;Sit to Supine Rolling: Max assist;+2 for physical assistance Sidelying to sit: Total assist;+2 for physical assistance   Sit to supine: Total assist;+2 for physical assistance   General bed mobility comments: pt was unable to focus on task past any intensity of pain caused by moving the R LE.  R LE was supported and pt was brought to EOB with significant physical assist with pt resistant throughout due to encephalopathy and pain.    Transfers                 General transfer  comment: unable, would need mechanical lift at this time    Balance Overall balance assessment: Needs assistance Sitting-balance support: Single extremity supported;Bilateral upper extremity supported;Feet supported;Feet unsupported Sitting balance-Leahy Scale: Poor Sitting balance - Comments: Extensive time spent getting pt into a safe sitting position at EOB without full success due to pt's resistance.  Pt was able to somewhat relax propped on L elbow, but needed full blocking from front and guarding/blocking from the back. Postural control: Posterior lean;Left lateral lean                                 ADL either performed or assessed with clinical judgement   ADL Overall ADL's : Needs assistance/impaired Eating/Feeding: Moderate assistance;Bed level   Grooming: Total assistance;Bed level   Upper Body Bathing: Total assistance   Lower Body Bathing: Total assistance;+2 for physical assistance;+2 for safety/equipment;Bed level   Upper Body Dressing : Total assistance   Lower Body Dressing: Total assistance;+2 for physical assistance;+2 for safety/equipment;Bed level       Toileting- Clothing Manipulation and Hygiene: Total assistance;+2 for physical assistance;+2 for safety/equipment;Bed level         General ADL Comments: Overall Total A for all ADLs due to inconsistent following of commands and active resistance to tasks. Needs at least +2 assist for LB ADLs in the bed     Vision Patient Visual Report: No change from baseline Vision Assessment?: No apparent visual deficits Additional Comments: to be further assessed     Perception  Praxis      Pertinent Vitals/Pain Pain Assessment: Faces Faces Pain Scale: Hurts whole lot Pain Location: R LE Pain Descriptors / Indicators: Grimacing;Guarding;Moaning Pain Intervention(s): Limited activity within patient's tolerance;Monitored during session     Hand Dominance  (unsure)   Extremity/Trunk  Assessment Upper Extremity Assessment Upper Extremity Assessment: Difficult to assess due to impaired cognition   Lower Extremity Assessment Lower Extremity Assessment: Defer to PT evaluation RLE Deficits / Details: painful, knee flexion passively against gravity sitting to 50* RLE: Unable to fully assess due to pain RLE Coordination: decreased fine motor;decreased gross motor LLE Deficits / Details: use to guard, likely functional strength, but pt unable to coordinate movement due to encephalopathy LLE Coordination: decreased fine motor   Cervical / Trunk Assessment Cervical / Trunk Assessment: Kyphotic   Communication Communication Communication: No difficulties   Cognition Arousal/Alertness: Lethargic Behavior During Therapy: Restless;Flat affect Overall Cognitive Status: Impaired/Different from baseline Area of Impairment: Orientation;Attention;Following commands;Safety/judgement;Awareness;Problem solving                 Orientation Level: Situation;Time;Place Current Attention Level: Focused   Following Commands: Follows one step commands inconsistently Safety/Judgement: Decreased awareness of safety;Decreased awareness of deficits Awareness: Intellectual Problem Solving: Slow processing;Decreased initiation;Difficulty sequencing;Requires verbal cues;Requires tactile cues General Comments: responds to name, would not answer any orientation questions. mostly moans and grunts as a reply. inconsistent following of commands, often noted to resist movement at times   General Comments  VSS, nasal canula partially out of nose    Exercises     Shoulder Instructions      Home Living Family/patient expects to be discharged to:: Private residence Living Arrangements: Spouse/significant other;Parent Available Help at Discharge: Family                             Additional Comments: attempted to call family but no answer. pt eventually reports living with  husband and mother      Prior Functioning/Environment Level of Independence: Needs assistance  Gait / Transfers Assistance Needed: per chart, ambulatory without AD     Comments: Pt would not answer any PLOF questions so unsure of functional abilities.        OT Problem List: Decreased strength;Decreased activity tolerance;Impaired balance (sitting and/or standing);Decreased coordination;Decreased cognition;Decreased safety awareness;Decreased knowledge of precautions;Decreased knowledge of use of DME or AE;Pain;Obesity      OT Treatment/Interventions: Self-care/ADL training;Therapeutic exercise;Energy conservation;DME and/or AE instruction;Therapeutic activities;Balance training;Patient/family education    OT Goals(Current goals can be found in the care plan section) Acute Rehab OT Goals Patient Stated Goal: pt unable OT Goal Formulation: Patient unable to participate in goal setting Time For Goal Achievement: 09/18/20 Potential to Achieve Goals: Fair ADL Goals Pt Will Perform Grooming: with supervision;sitting Pt Will Perform Upper Body Bathing: with min assist;sitting Pt Will Perform Lower Body Bathing: with mod assist;sitting/lateral leans;bed level Additional ADL Goal #1: Pt to demo ability to sit EOB > 5 min with no more than Min A to maintain balance during ADLs Additional ADL Goal #2: Pt to demo bed mobility at Mod A in prep for ADL transfers  OT Frequency: Min 2X/week   Barriers to D/C:            Co-evaluation PT/OT/SLP Co-Evaluation/Treatment: Yes Reason for Co-Treatment: Complexity of the patient's impairments (multi-system involvement);Necessary to address cognition/behavior during functional activity;For patient/therapist safety;To address functional/ADL transfers PT goals addressed during session: Mobility/safety with mobility OT goals addressed during session: ADL's  and self-care      AM-PAC OT "6 Clicks" Daily Activity     Outcome Measure Help from another  person eating meals?: A Lot Help from another person taking care of personal grooming?: Total Help from another person toileting, which includes using toliet, bedpan, or urinal?: Total Help from another person bathing (including washing, rinsing, drying)?: Total Help from another person to put on and taking off regular upper body clothing?: Total Help from another person to put on and taking off regular lower body clothing?: Total 6 Click Score: 7   End of Session Equipment Utilized During Treatment: Oxygen Nurse Communication: Mobility status  Activity Tolerance: Other (comment);Patient limited by pain (limited due to cognition) Patient left: in bed;with call bell/phone within reach  OT Visit Diagnosis: Unsteadiness on feet (R26.81);Other abnormalities of gait and mobility (R26.89);Muscle weakness (generalized) (M62.81);History of falling (Z91.81);Other symptoms and signs involving cognitive function;Pain Pain - Right/Left: Right Pain - part of body: Leg;Knee                Time: 9476-5465 OT Time Calculation (min): 24 min Charges:  OT General Charges $OT Visit: 1 Visit OT Evaluation $OT Eval Moderate Complexity: 1 Mod  Bradd Canary, OTR/L Acute Rehab Services Office: [phone]   Lorre Munroe 09/04/2020, 12:18 PM

## 2020-09-05 ENCOUNTER — Inpatient Hospital Stay (HOSPITAL_COMMUNITY): Payer: Medicare HMO

## 2020-09-05 ENCOUNTER — Encounter (HOSPITAL_COMMUNITY): Payer: Self-pay | Admitting: Internal Medicine

## 2020-09-05 LAB — GLUCOSE, CAPILLARY
Glucose-Capillary: 118 mg/dL — ABNORMAL HIGH (ref 70–99)
Glucose-Capillary: 139 mg/dL — ABNORMAL HIGH (ref 70–99)
Glucose-Capillary: 146 mg/dL — ABNORMAL HIGH (ref 70–99)
Glucose-Capillary: 150 mg/dL — ABNORMAL HIGH (ref 70–99)
Glucose-Capillary: 161 mg/dL — ABNORMAL HIGH (ref 70–99)
Glucose-Capillary: 165 mg/dL — ABNORMAL HIGH (ref 70–99)
Glucose-Capillary: 165 mg/dL — ABNORMAL HIGH (ref 70–99)

## 2020-09-05 LAB — CBC
HCT: 29.8 % — ABNORMAL LOW (ref 36.0–46.0)
Hemoglobin: 9.4 g/dL — ABNORMAL LOW (ref 12.0–15.0)
MCH: 30.3 pg (ref 26.0–34.0)
MCHC: 31.5 g/dL (ref 30.0–36.0)
MCV: 96.1 fL (ref 80.0–100.0)
Platelets: 191 10*3/uL (ref 150–400)
RBC: 3.1 MIL/uL — ABNORMAL LOW (ref 3.87–5.11)
RDW: 13.2 % (ref 11.5–15.5)
WBC: 7.2 10*3/uL (ref 4.0–10.5)
nRBC: 0 % (ref 0.0–0.2)

## 2020-09-05 LAB — BASIC METABOLIC PANEL
Anion gap: 6 (ref 5–15)
BUN: 30 mg/dL — ABNORMAL HIGH (ref 8–23)
CO2: 31 mmol/L (ref 22–32)
Calcium: 9.2 mg/dL (ref 8.9–10.3)
Chloride: 105 mmol/L (ref 98–111)
Creatinine, Ser: 1.13 mg/dL — ABNORMAL HIGH (ref 0.44–1.00)
GFR, Estimated: 55 mL/min — ABNORMAL LOW (ref >60)
Glucose, Bld: 152 mg/dL — ABNORMAL HIGH (ref 70–99)
Potassium: 4.4 mmol/L (ref 3.5–5.1)
Sodium: 142 mmol/L (ref 135–145)

## 2020-09-05 MED ORDER — VITAMIN B-12 1000 MCG PO TABS
500.0000 ug | ORAL_TABLET | Freq: Every day | ORAL | Status: DC
  Administered 2020-09-06 – 2020-09-15 (×10): 500 ug via ORAL
  Filled 2020-09-05 (×10): qty 1

## 2020-09-05 MED ORDER — CYANOCOBALAMIN 1000 MCG/ML IJ SOLN
1000.0000 ug | Freq: Once | INTRAMUSCULAR | Status: AC
  Administered 2020-09-05: 1000 ug via INTRAMUSCULAR
  Filled 2020-09-05: qty 1

## 2020-09-05 MED ORDER — VITAMIN D (ERGOCALCIFEROL) 1.25 MG (50000 UNIT) PO CAPS
50000.0000 [IU] | ORAL_CAPSULE | ORAL | Status: DC
  Administered 2020-09-05 – 2020-09-12 (×2): 50000 [IU] via ORAL
  Filled 2020-09-05 (×2): qty 1

## 2020-09-05 NOTE — Progress Notes (Signed)
PROGRESS NOTE    Rebecca Kemp  TKZ:[id number] DOB: Dec 02, 1955 DOA: 09/01/2020 PCP: Pcp, No    No chief complaint on file.   Brief Narrative:  Rebecca Kemp is a 65 y.o. female with medical history significant of hypertension, asthma, type 2 diabetes, GERD presented to [facility] ED via EMS for evaluation of right leg injury and inability to ambulate after a mechanical fall at a grocery store. Pt seen and examined at bedside.   Patient was seen by orthopedics at [facility] who recommended transfer to a tertiary care center for trauma specialist.  Patient was placed in a long leg splint with 45 degrees of knee flexion.  ED physician spoke to Dr. Eulah Pont with orthopedics at [facility] who agreed to see the patient in consultation.  Patient had also complained of left rib pain, left hand pain, and right hip pain.  X-rays of left ribs and chest, left hand, and right hip/pelvis negative for acute finding.  pt was started on BIPAP as she was found to be altered mental status and elevated co2  on ABG.   Orthopedics consulted, she underwent -Open reduction internal fixation of right intra-articular distal femur fracture on 09/03/20/  Pt seen and examined, remained confused.  Assessment & Plan:   Principal Problem:   Femur fracture (HCC) Active Problems:   Acute respiratory failure (HCC)   Encephalopathy   Essential hypertension   Type 2 diabetes mellitus (HCC)   Acute metabolic encephalopathy:  Probably secondary to hypercarbic respiratory failure, pain meds. No focal deficits.  Improving, but not back to baseline yet.  ABG shows improvement in PH to 7.35, and improvement of pCO2 to 56.  Will obtain MRI of the brain without contrast for further evaluation.      Acute femur fracture post fall Orthopedics consulted, she underwent -Open reduction internal fixation of right intra-articular distal femur fracture on 09/03/20, therapy eval recommending SNF.  Pai control.      Hypertension:  Well controlled, resume home meds.     Type 2 DM; non insulin dependent.  CBG (last 3)  Recent Labs    09/05/20 0432 09/05/20 0737 09/05/20 1134  GLUCAP 139* 146* 165*    Resume SSI, no change in meds.  Hemoglobin A1c is 6.8%   Acute hypercarbic respiratory failure / OHS/ OSA,  As per the daughter , she was on CPAP previously, but current not using it.  She will need CPAP prior to discharge.  Initially requiring BIPAP, currently on RA.     Stage 3b CKD:  Creatinine on admission is 1.8, improved to 1.1.     Diabetic neuropathy:  Resume gabapentin.   Anemia:  ?  Chronic disease, normocytic anemia.  Anemia of acute blood loss from the surgery.  Hemoglobin on admission is 10, not sure what her baseline hemoglobin is.  Dropped to 9 and stable around 9.  Low vit b12 levels supplementation ordered.  Continue to monitor.    There is no height or weight on file to calculate BMI.     DVT prophylaxis: Lovenox Code Status: full code Family Communication: family at bedside.  Disposition:   Status is: Inpatient  Remains inpatient appropriate because:Ongoing diagnostic testing needed not appropriate for outpatient work up and Unsafe d/c plan  Dispo: The patient is from: Home              Anticipated d/c is to:  pending  Patient currently is not medically stable to d/c.   Difficult to place patient No       Consultants:  orthopedics  Procedures: none  Antimicrobials:  Antibiotics Given (last 72 hours)     Date/Time Action Medication Dose Rate   09/03/20 1512 Given   ceFAZolin (ANCEF) IVPB 3g/100 mL premix 3 g    09/03/20 1555 Given  [exp 03/2023]   vancomycin (VANCOCIN) powder 1,000 mg    09/03/20 2216 New Bag/Given   ceFAZolin (ANCEF) IVPB 2g/100 mL premix 2 g 200 mL/hr   09/04/20 1309 New Bag/Given   ceFAZolin (ANCEF) IVPB 2g/100 mL premix 2 g 200 mL/hr         Subjective: She is alert and able to answer  simple questions.   Objective: Vitals:   09/04/20 2251 09/04/20 2340 09/05/20 0300 09/05/20 0803  BP: (!) 149/70 (!) 146/76 100/65 123/88  Pulse: 91 76 71 87  Resp: (!) 21 20 20 18   Temp:  98.7 F (37.1 C) 97.6 F (36.4 C) 98.3 F (36.8 C)  TempSrc:  Oral Axillary Oral  SpO2: 100% 100% 100% 95%  Weight:        Intake/Output Summary (Last 24 hours) at 09/05/2020 1233 Last data filed at 09/05/2020 0900 Gross per 24 hour  Intake 1881.92 ml  Output 1625 ml  Net 256.92 ml    Filed Weights   09/02/20 0025  Weight: (!) 196 kg    Examination:  General exam: Obese elderly woman not in any kind of distress Respiratory system: Reduced air entry at bases, no wheezing or rhonchi on room air  Cardiovascular system: S1-S2 heard heard, regular rate rhythm, no JVD Gastrointestinal system: Abdomen is soft, non tender non distended bowel sounds wnl.  Central nervous system: Alert and oriented Extremities: Pedal edema present Skin: No rashes.  Psychiatry: Mood is appropriate    Data Reviewed: I have personally reviewed following labs and imaging studies  CBC: Recent Labs  Lab 09/03/20 0054 09/03/20 2027 09/04/20 0156 09/05/20 0101  WBC 8.9 10.4 8.7 7.2  NEUTROABS 6.2  --   --   --   HGB 10.2* 9.9* 9.1* 9.4*  HCT 32.4* 31.1* 28.4* 29.8*  MCV 97.0 95.7 96.3 96.1  PLT 209 196 201 191     Basic Metabolic Panel: Recent Labs  Lab 09/03/20 0054 09/03/20 1927 09/04/20 0156 09/05/20 0101  NA 140  --  143 142  K 4.5  --  4.4 4.4  CL 103  --  106 105  CO2 31  --  33* 31  GLUCOSE 147*  --  145* 152*  BUN 38*  --  33* 30*  CREATININE 1.84* 1.43* 1.32* 1.13*  CALCIUM 9.3  --  9.0 9.2     GFR: CrCl cannot be calculated (Unknown ideal weight.).  Liver Function Tests: Recent Labs  Lab 09/03/20 0054  AST 13*  ALT 13  ALKPHOS 74  BILITOT 0.8  PROT 5.7*  ALBUMIN 2.5*     CBG: Recent Labs  Lab 09/04/20 1958 09/05/20 0124 09/05/20 0432 09/05/20 0737  09/05/20 1134  GLUCAP 141* 161* 139* 146* 165*      Recent Results (from the past 240 hour(s))  SARS CORONAVIRUS 2 (TAT 6-24 HRS) Nasopharyngeal Nasopharyngeal Swab     Status: None   Collection Time: 09/02/20  3:20 AM   Specimen: Nasopharyngeal Swab  Result Value Ref Range Status   SARS Coronavirus 2 NEGATIVE NEGATIVE Final    Comment: (NOTE) SARS-CoV-2 target nucleic acids  are NOT DETECTED.  The SARS-CoV-2 RNA is generally detectable in upper and lower respiratory specimens during the acute phase of infection. Negative results do not preclude SARS-CoV-2 infection, do not rule out co-infections with other pathogens, and should not be used as the sole basis for treatment or other patient management decisions. Negative results must be combined with clinical observations, patient history, and epidemiological information. The expected result is Negative.  Fact Sheet for Patients: HairSlick.no  Fact Sheet for Healthcare Providers: quierodirigir.com  This test is not yet approved or cleared by the [place] FDA and  has been authorized for detection and/or diagnosis of SARS-CoV-2 by FDA under an Emergency Use Authorization (EUA). This EUA will remain  in effect (meaning this test can be used) for the duration of the COVID-19 declaration under Se ction 564(b)(1) of the Act, 21 U.S.C. section 360bbb-3(b)(1), unless the authorization is terminated or revoked sooner.  Performed at [facility], [address] [place], [place] [address]   Surgical PCR screen     Status: None   Collection Time: 09/02/20  4:40 PM   Specimen: Nasal Mucosa; Nasal Swab  Result Value Ref Range Status   MRSA, PCR NEGATIVE NEGATIVE Final   Staphylococcus aureus NEGATIVE NEGATIVE Final    Comment: (NOTE) The Xpert SA Assay (FDA approved for NASAL specimens in patients 65 years of age and older), is one component of a  comprehensive surveillance program. It is not intended to diagnose infection nor to guide or monitor treatment. Performed at [facility], [address] [place], [place] [address]           Radiology Studies: DG Abd 1 View  Result Date: 09/05/2020 CLINICAL DATA:  Follow-up RIGHT LOWER QUADRANT abdominal pain. EXAM: ABDOMEN - 1 VIEW COMPARISON:  06/02/2020 FINDINGS: Bowel gas pattern is nonobstructive. Surgical clips are present in the RIGHT UPPER QUADRANT of the abdomen. Known LEFT LOWER QUADRANT hernia is not well seen. IMPRESSION: Nonobstructive bowel gas pattern. Electronically Signed   By: Norva Pavlov M.D.   On: 09/05/2020 10:57   DG CHEST PORT 1 VIEW  Result Date: 09/05/2020 CLINICAL DATA:  Follow-up. RIGHT LOWER QUADRANT abdominal pain. History of asthma. EXAM: PORTABLE CHEST 1 VIEW COMPARISON:  08/31/2020 FINDINGS: Stable cardiomegaly. Lungs are free of focal consolidations and pleural effusions. There is no pulmonary edema. IMPRESSION: Stable cardiomegaly. Electronically Signed   By: Norva Pavlov M.D.   On: 09/05/2020 10:55   DG C-Arm 1-60 Min  Result Date: 09/03/2020 CLINICAL DATA:  Surgery, elective. Additional history provided: Open reduction internal fixation (ORIF) distal femur fracture (right). Provided fluoroscopy time 1 minutes, 20 seconds (10.86 mGy). EXAM: RIGHT FEMUR 2 VIEWS; DG C-ARM 1-60 MIN COMPARISON:  CT of the right lower extremity 08/31/2020. FINDINGS: Five intraoperative fluoroscopic images of the right femur are submitted. On the provided images, there are findings of interval ORIF of a known comminuted distal right femoral fracture utilizing a lateral plate and multiple interlocking screws. Persistent although improved displacement at the fracture sites. IMPRESSION: Five intraoperative fluoroscopic images of the right femur from right femoral ORIF, as described. Electronically Signed   By: Jackey Loge DO   On: 09/03/2020 16:57   DG FEMUR, MIN 2  VIEWS RIGHT  Result Date: 09/03/2020 CLINICAL DATA:  Surgery, elective. Additional history provided: Open reduction internal fixation (ORIF) distal femur fracture (right). Provided fluoroscopy time 1 minutes, 20 seconds (10.86 mGy). EXAM: RIGHT FEMUR 2 VIEWS; DG C-ARM 1-60 MIN COMPARISON:  CT  of the right lower extremity 08/31/2020. FINDINGS: Five intraoperative fluoroscopic images of the right femur are submitted. On the provided images, there are findings of interval ORIF of a known comminuted distal right femoral fracture utilizing a lateral plate and multiple interlocking screws. Persistent although improved displacement at the fracture sites. IMPRESSION: Five intraoperative fluoroscopic images of the right femur from right femoral ORIF, as described. Electronically Signed   By: Jackey Loge DO   On: 09/03/2020 16:57   DG FEMUR PORT, MIN 2 VIEWS RIGHT  Result Date: 09/03/2020 CLINICAL DATA:  Post ORIF, right femur fracture EXAM: RIGHT FEMUR PORTABLE 2 VIEW COMPARISON:  CT 08/31/2020 intraoperative fluoroscopy 09/03/2020 FINDINGS: Patient is post ORIF of the comminuted distal femoral fracture with intra-articular extension. Improved alignment post placement of a lateral plate and screw fixation construct. Some residual cortical step-off noted along the medial femoral metadiaphysis. Excreted postsurgical soft tissue changes including diffuse soft tissue swelling, soft tissue gas and pneumarthrosis. No acute hardware complication is evident. No unexpected retained foreign bodies. IMPRESSION: Post ORIF of a comminuted distal femoral fracture with intra-articular extension. Alignment as above. No acute complication or unexpected retained foreign bodies. See operative report for further details. Electronically Signed   By: Kreg Shropshire M.D.   On: 09/03/2020 19:56        Scheduled Meds:  cholecalciferol  2,000 Units Oral Daily   cyanocobalamin  1,000 mcg Intramuscular Once   docusate sodium  100 mg Oral  BID   enoxaparin (LOVENOX) injection  40 mg Subcutaneous Q24H   escitalopram  20 mg Oral Daily   gabapentin  400 mg Oral TID   irbesartan  300 mg Oral Daily   And   hydrochlorothiazide  25 mg Oral Daily   insulin aspart  0-9 Units Subcutaneous Q4H   lactulose  20 g Oral BID   [START ON 09/06/2020] vitamin B-12  500 mcg Oral Daily   Vitamin D (Ergocalciferol)  50,000 Units Oral Q7 days   Continuous Infusions:  sodium chloride 75 mL/hr at 09/05/20 0010     LOS: 4 days       Kathlen Mody, MD [facility]   To contact the attending provider between 7A-7P or the covering provider during after hours 7P-7A, please log into the web site [email or web address] and access using universal [place] password for that web site. If you do not have the password, please call the hospital operator.  09/05/2020, 12:33 PM

## 2020-09-05 NOTE — Social Work (Signed)
  CSW attempted to address the SNF consult however pt was not in room. CSW will follow up later.

## 2020-09-05 NOTE — Progress Notes (Signed)
SPORTS MEDICINE AND JOINT REPLACEMENT  Georgena Spurling, MD    Laurier Nancy, PA-C [address] [place], [place], [place]  [address]                             [phone]   PROGRESS NOTE  Subjective:  negative for Chest Pain  negative for Shortness of Breath  negative for Nausea/Vomiting   negative for Calf Pain  negative for Bowel Movement   Tolerating Diet: yes         Patient reports pain as 5 on 0-10 scale.    Objective: Vital signs in last 24 hours:   Patient Vitals for the past 24 hrs:  BP Temp Temp src Pulse Resp SpO2  09/05/20 0300 100/65 97.6 F (36.4 C) Axillary 71 20 100 %  09/04/20 2340 (!) 146/76 98.7 F (37.1 C) Oral 76 20 100 %  09/04/20 2251 (!) 149/70 -- -- 91 (!) 21 100 %  09/04/20 1955 (!) 142/74 98.7 F (37.1 C) Oral 93 16 92 %  09/04/20 1635 (!) 168/88 98.9 F (37.2 C) Oral 96 18 96 %  09/04/20 1157 137/80 98.8 F (37.1 C) Oral 81 18 100 %  09/04/20 0825 (!) 121/57 98.9 F (37.2 C) Oral 86 18 97 %  09/04/20 0817 -- -- -- 85 19 96 %    @flow {1959:LAST@   Intake/Output from previous day:   07/01 0701 - 07/02 0700 In: 1401.9 [I.V.:1401.9] Out: 1125 [Urine:1125]   Intake/Output this shift:   No intake/output data recorded.   Intake/Output      07/01 0701 07/02 0700 07/02 0701 07/03 0700   P.O.     I.V. (mL/kg) 1401.9 (7.2)    IV Piggyback     Total Intake(mL/kg) 1401.9 (7.2)    Urine (mL/kg/hr) 1125 (0.2)    Blood     Total Output 1125    Net +276.9         Urine Occurrence 1 x       LABORATORY DATA: Recent Labs    09/03/20 0054 09/03/20 2027 09/04/20 0156 09/05/20 0101  WBC 8.9 10.4 8.7 7.2  HGB 10.2* 9.9* 9.1* 9.4*  HCT 32.4* 31.1* 28.4* 29.8*  PLT 209 196 201 191   Recent Labs    09/03/20 0054 09/03/20 1927 09/04/20 0156 09/05/20 0101  NA 140  --  143 142  K 4.5  --  4.4 4.4  CL 103  --  106 105  CO2 31  --  33* 31  BUN 38*  --  33* 30*  CREATININE 1.84* 1.43* 1.32* 1.13*  GLUCOSE 147*  --  145* 152*  CALCIUM  9.3  --  9.0 9.2   Lab Results  Component Value Date   INR 1.2 09/03/2020    Examination:  General appearance: alert, cooperative, and no distress Extremities: extremities normal, atraumatic, no cyanosis or edema  Wound Exam: clean, dry, intact   Drainage:  Scant/small amount Bloody exudate  Motor Exam: Quadriceps and Hamstrings Intact  Sensory Exam: Deep Peroneal and Tibial normal   Assessment:    2 Days Post-Op  Procedure(s) (LRB): OPEN REDUCTION INTERNAL FIXATION (ORIF) DISTAL FEMUR FRACTURE (Right)  ADDITIONAL DIAGNOSIS:  Principal Problem:   Femur fracture (HCC) Active Problems:   Acute respiratory failure (HCC)   Encephalopathy   Essential hypertension   Type 2 diabetes mellitus (HCC)     Plan: Physical Therapy as ordered Touch Down Weight Bearing (TDWB)  ROM: ok for unrestricted knee motion Incisional and dressing care: Proximal dressing removed, distal dressing reinforced due to drainage Showering: Bed bath for now. Ok to begin showering with assistance 09/07/20 if no drainage from incisions Orthopedic device(s): None  Pain management: 1. Tylenol 650 mg q 6 hours PRN 2. Percocet 5-325 q 4 hour PRN 3. Neurontin 400 mg TID 4. Dilaudid 0.5 mg q 4 hours PRN VTE prophylaxis: Lovenox, SCDs ID:  Ancef 2gm post op Foley/Lines:  No foley, KVO IVFs Impediments to Fracture Healing: Vit D level 20, start supplementation Dispo: pain control, continue pt/ot, likely need SNF   Guy Sandifer 09/05/2020, 7:17 AM

## 2020-09-05 NOTE — Progress Notes (Signed)
Attempted MRI. The MRI table would not work with pt on it. Rebooted system 2x, still would not work. In the process of changing to another MRI, the patient refused to continue.

## 2020-09-05 NOTE — Progress Notes (Signed)
Orthopedic Tech Progress Note Patient Details:  Rebecca Kemp 1955-12-02 [id number]  Patient ID: Rebecca Kemp, female   DOB: 1955/10/09, 65 y.o.   MRN: [id number] I was called to help change padding and ace wrap on rle. Trinna Post 09/05/2020, 7:50 PM

## 2020-09-06 LAB — BASIC METABOLIC PANEL
Anion gap: 9 (ref 5–15)
BUN: 30 mg/dL — ABNORMAL HIGH (ref 8–23)
CO2: 27 mmol/L (ref 22–32)
Calcium: 9.1 mg/dL (ref 8.9–10.3)
Chloride: 104 mmol/L (ref 98–111)
Creatinine, Ser: 1.29 mg/dL — ABNORMAL HIGH (ref 0.44–1.00)
GFR, Estimated: 47 mL/min — ABNORMAL LOW (ref >60)
Glucose, Bld: 135 mg/dL — ABNORMAL HIGH (ref 70–99)
Potassium: 4 mmol/L (ref 3.5–5.1)
Sodium: 140 mmol/L (ref 135–145)

## 2020-09-06 LAB — GLUCOSE, CAPILLARY
Glucose-Capillary: 127 mg/dL — ABNORMAL HIGH (ref 70–99)
Glucose-Capillary: 130 mg/dL — ABNORMAL HIGH (ref 70–99)
Glucose-Capillary: 138 mg/dL — ABNORMAL HIGH (ref 70–99)
Glucose-Capillary: 141 mg/dL — ABNORMAL HIGH (ref 70–99)
Glucose-Capillary: 143 mg/dL — ABNORMAL HIGH (ref 70–99)
Glucose-Capillary: 144 mg/dL — ABNORMAL HIGH (ref 70–99)
Glucose-Capillary: 166 mg/dL — ABNORMAL HIGH (ref 70–99)

## 2020-09-06 MED ORDER — AMLODIPINE BESYLATE 5 MG PO TABS
5.0000 mg | ORAL_TABLET | Freq: Every day | ORAL | Status: DC
  Administered 2020-09-06 – 2020-09-15 (×9): 5 mg via ORAL
  Filled 2020-09-06 (×10): qty 1

## 2020-09-06 MED ORDER — TIZANIDINE HCL 4 MG PO TABS
4.0000 mg | ORAL_TABLET | Freq: Every day | ORAL | Status: DC
  Administered 2020-09-06 – 2020-09-14 (×9): 4 mg via ORAL
  Filled 2020-09-06 (×10): qty 1

## 2020-09-06 MED ORDER — GABAPENTIN 400 MG PO CAPS
400.0000 mg | ORAL_CAPSULE | Freq: Every day | ORAL | Status: DC
  Administered 2020-09-07 – 2020-09-14 (×8): 400 mg via ORAL
  Filled 2020-09-06 (×8): qty 1

## 2020-09-06 NOTE — TOC Initial Note (Signed)
Transition of Care [facility]) - Initial/Assessment Note    Patient Details  Name: Rebecca Kemp MRN: [id number] Date of Birth: [birth year]  [facility]) CM/SW Contact:    Bary Castilla, LCSW Phone Number: [phone] 09/06/2020, 11:28 AM  Clinical Narrative:                  CSW met with patient to discuss PT recommendation of a SNF. Patient was aware of recommendation and tentatively agreed to going to a ST SNF. CSW discussed the SNF process.CSW provided patient with medicare.gov rating list. Patient called her daughter Albina Billet and also discussed her possible being discharged home with daughter. Patient gave CSW permission to fax referrals out to local facilities as well as facilities near her home. CSW alerted pt that her insurance is contracted with less SNF facilities .CSW answered questions about the SNF process and the next steps in the process. Patient agreed to proceed with the SNF process however may want to go home with daughter Melida Quitter address: [address], [place] [address]. Daughter would be able to provide 24 hour supervision.  TOC team will continue to assist with discharge planning needs.      Expected Discharge Plan: Skilled Nursing Facility Barriers to Discharge: Continued Medical Work up, SNF Pending bed offer, Insurance Authorization   Patient Goals and CMS Choice Patient states their goals for this hospitalization and ongoing recovery are:: To be able to return home CMS Medicare.gov Compare Post Acute Care list provided to:: Patient    Expected Discharge Plan and Services Expected Discharge Plan: [place]       Living arrangements for the past 2 months: Mobile Home                                      Prior Living Arrangements/Services Living arrangements for the past 2 months: Mobile Home Lives with:: Self, Spouse Patient language and need for interpreter reviewed:: Yes          Care giver support system in  place?: Yes (comment)      Activities of Daily Living Home Assistive Devices/Equipment: Other (Comment) ADL Screening (condition at time of admission) Patient's cognitive ability adequate to safely complete daily activities?: Yes Is the patient deaf or have difficulty hearing?: No Does the patient have difficulty seeing, even when wearing glasses/contacts?: No Does the patient have difficulty concentrating, remembering, or making decisions?: No Patient able to express need for assistance with ADLs?: Yes Does the patient have difficulty dressing or bathing?: Yes Independently performs ADLs?: No Communication: Independent Dressing (OT): Needs assistance Is this a change from baseline?: Change from baseline, expected to last >3 days Grooming: Needs assistance Is this a change from baseline?: Change from baseline, expected to last >3 days Feeding: Independent Bathing: Needs assistance Is this a change from baseline?: Change from baseline, expected to last <3 days Toileting: Needs assistance Is this a change from baseline?: Change from baseline, expected to last >3days In/Out Bed: Dependent Is this a change from baseline?: Change from baseline, expected to last >3 days Walks in Home: Dependent Is this a change from baseline?: Change from baseline, expected to last >3 days Does the patient have difficulty walking or climbing stairs?: Yes Weakness of Legs: Both Weakness of Arms/Hands: None  Permission Sought/Granted   Permission granted to share information with : Yes, Verbal Permission Granted  Share Information  with NAME: Albina Billet  Permission granted to share info w AGENCY: SNFs  Permission granted to share info w Relationship: daughter  Permission granted to share info w Contact Information: [phone]  Emotional Assessment Appearance:: Appears stated age Attitude/Demeanor/Rapport: Engaged   Orientation: : Oriented to Self, Oriented to Place, Oriented to  Time, Oriented to  Situation      Admission diagnosis:  Femur fracture ([place]) [S72.90XA] Patient Active Problem List   Diagnosis Date Noted   Femur fracture ([place]) 09/02/2020   Acute respiratory failure ([place]) 09/02/2020   Encephalopathy 09/02/2020   Essential hypertension 09/02/2020   Type 2 diabetes mellitus ([place]) 09/02/2020   PCP:  Merryl Hacker No Pharmacy:   [place], [facility] [employer] [facility] [phone] [address] [place] [address] Phone: [phone] Fax: [place], [place] - [address] [place] [address] Phone: [phone] Fax: [phone]     Social Determinants of Health (SDOH) Interventions    Readmission Risk Interventions No flowsheet data found.

## 2020-09-06 NOTE — Progress Notes (Signed)
PROGRESS NOTE    Rebecca Kemp  EHU:[id number] DOB: 1955/05/19 DOA: 09/01/2020 PCP: Pcp, No    No chief complaint on file.   Brief Narrative:  Rebecca Kemp is a 65 y.o. female with medical history significant of hypertension, asthma, type 2 diabetes, GERD presented to [facility] ED via EMS for evaluation of right leg injury and inability to ambulate after a mechanical fall at a grocery store. Pt seen and examined at bedside.   Patient was seen by orthopedics at [facility] who recommended transfer to a tertiary care center for trauma specialist.  Patient was placed in a long leg splint with 45 degrees of knee flexion.  ED physician spoke to Dr. Eulah Pont with orthopedics at [facility] who agreed to see the patient in consultation.  Patient had also complained of left rib pain, left hand pain, and right hip pain.  X-rays of left ribs and chest, left hand, and right hip/pelvis negative for acute finding.  pt was started on BIPAP as she was found to be altered mental status and elevated co2  on ABG.   Orthopedics consulted, she underwent -Open reduction internal fixation of right intra-articular distal femur fracture on 09/03/20/   Pt seen and examined at bedside, she is alert and oriented at this time.   Assessment & Plan:   Principal Problem:   Femur fracture (HCC) Active Problems:   Acute respiratory failure (HCC)   Encephalopathy   Essential hypertension   Type 2 diabetes mellitus (HCC)   Acute metabolic encephalopathy:  Probably secondary to hypercarbic respiratory failure, pain meds. No focal deficits.  Back to baseline at this time. She is alert and oriented.  ABG shows improvement in PH to 7.35, and improvement of pCO2 to 56.  Couldn't do the MRI brain as pt could not tolerate it.      Acute femur fracture post fall Orthopedics consulted, she underwent -Open reduction internal fixation of right intra-articular distal femur fracture on 09/03/20, therapy eval  recommending SNF.  Pai control.  No new changes.     Hypertension:  Better controlled BP measurements.     Type 2 DM; non insulin dependent.  CBG (last 3)  Recent Labs    09/06/20 0413 09/06/20 0813 09/06/20 1229  GLUCAP 127* 144* 143*    Resume SSI, no change in meds.  Hemoglobin A1c is 6.8%   Acute hypercarbic respiratory failure / OHS/ OSA,  As per the daughter , she was on CPAP previously about 10 years ago, but current not using it.  She will need CPAP prior to discharge. She will need sleep study as outpatient and will need referral to pulmonology for sleep study.  Initially requiring BIPAP, currently on RA. Recommend to wean her off oxygen.     Stage 3b CKD:  Creatinine on admission is 1.8, improved to 1.3, continue to monitor.     Diabetic neuropathy:  Resume gabapentin qhs as reported..   Anemia:  ?  Chronic disease, normocytic anemia.  Anemia of acute blood loss from the surgery.  Hemoglobin on admission is 10, not sure what her baseline hemoglobin is.  Dropped to 9 and stable around 9.  Low vit b12 levels supplementation ordered.  Continue to monitor.    Body mass index is 60.25 kg/m. Morbid obesity Recommend outpatient follow up with PCP regarding referral to bariatric surgery.      DVT prophylaxis: Lovenox Code Status: full code Family Communication: family at bedside.  Disposition:   Status is:  Inpatient  Remains inpatient appropriate because:Ongoing diagnostic testing needed not appropriate for outpatient work up and Unsafe d/c plan  Dispo: The patient is from: Home              Anticipated d/c is to:  pending              Patient currently is not medically stable to d/c.   Difficult to place patient No       Consultants:  orthopedics  Procedures: -Open reduction internal fixation of right intra-articular distal femur fracture  Antimicrobials:  Antibiotics Given (last 72 hours)     Date/Time Action Medication Dose Rate    09/03/20 1512 Given   ceFAZolin (ANCEF) IVPB 3g/100 mL premix 3 g    09/03/20 1555 Given  [exp 03/2023]   vancomycin (VANCOCIN) powder 1,000 mg    09/03/20 2216 New Bag/Given   ceFAZolin (ANCEF) IVPB 2g/100 mL premix 2 g 200 mL/hr   09/04/20 1309 New Bag/Given   ceFAZolin (ANCEF) IVPB 2g/100 mL premix 2 g 200 mL/hr         Subjective: No new complaints. No chest pain or sob.   Objective: Vitals:   09/06/20 0406 09/06/20 0815 09/06/20 0849 09/06/20 1100  BP: 132/77 (!) 165/78 (!) 165/78   Pulse: 75 91 96   Resp: 17 20 (!) 21   Temp: 98.2 F (36.8 C) 97.9 F (36.6 C)    TempSrc: Oral Oral    SpO2: 100% 100% 97%   Weight:      Height:    5\' 11"  (1.803 m)    Intake/Output Summary (Last 24 hours) at 09/06/2020 1302 Last data filed at 09/05/2020 1820 Gross per 24 hour  Intake 490.52 ml  Output 600 ml  Net -109.48 ml    Filed Weights   09/02/20 0025  Weight: (!) 196 kg    Examination:  General exam: Morbidly obese elderly woman, not in any kind of distress Respiratory system: Air entry fair bilateral, no wheezing or rhonchi Cardiovascular system: S1-S2 heard, tachycardic, no JVD, no pedal edema Gastrointestinal system: Abdomen is soft nontender nondistended, bowel sounds within normal limits Central nervous system: Alert and oriented, grossly nonfocal Extremities: No pedal edema Skin: No rashes seen Psychiatry: Mood is appropriate    Data Reviewed: I have personally reviewed following labs and imaging studies  CBC: Recent Labs  Lab 09/03/20 0054 09/03/20 2027 09/04/20 0156 09/05/20 0101  WBC 8.9 10.4 8.7 7.2  NEUTROABS 6.2  --   --   --   HGB 10.2* 9.9* 9.1* 9.4*  HCT 32.4* 31.1* 28.4* 29.8*  MCV 97.0 95.7 96.3 96.1  PLT 209 196 201 191     Basic Metabolic Panel: Recent Labs  Lab 09/03/20 0054 09/03/20 1927 09/04/20 0156 09/05/20 0101 09/06/20 0134  NA 140  --  143 142 140  K 4.5  --  4.4 4.4 4.0  CL 103  --  106 105 104  CO2 31  --  33*  31 27  GLUCOSE 147*  --  145* 152* 135*  BUN 38*  --  33* 30* 30*  CREATININE 1.84* 1.43* 1.32* 1.13* 1.29*  CALCIUM 9.3  --  9.0 9.2 9.1     GFR: Estimated Creatinine Clearance: 85.2 mL/min (A) (by C-G formula based on SCr of 1.29 mg/dL (H)).  Liver Function Tests: Recent Labs  Lab 09/03/20 0054  AST 13*  ALT 13  ALKPHOS 74  BILITOT 0.8  PROT 5.7*  ALBUMIN 2.5*  CBG: Recent Labs  Lab 09/05/20 2041 09/05/20 2335 09/06/20 0413 09/06/20 0813 09/06/20 1229  GLUCAP 165* 150* 127* 144* 143*      Recent Results (from the past 240 hour(s))  SARS CORONAVIRUS 2 (TAT 6-24 HRS) Nasopharyngeal Nasopharyngeal Swab     Status: None   Collection Time: 09/02/20  3:20 AM   Specimen: Nasopharyngeal Swab  Result Value Ref Range Status   SARS Coronavirus 2 NEGATIVE NEGATIVE Final    Comment: (NOTE) SARS-CoV-2 target nucleic acids are NOT DETECTED.  The SARS-CoV-2 RNA is generally detectable in upper and lower respiratory specimens during the acute phase of infection. Negative results do not preclude SARS-CoV-2 infection, do not rule out co-infections with other pathogens, and should not be used as the sole basis for treatment or other patient management decisions. Negative results must be combined with clinical observations, patient history, and epidemiological information. The expected result is Negative.  Fact Sheet for Patients: HairSlick.nohttps://www.fda.gov/media/138098/download  Fact Sheet for Healthcare Providers: quierodirigir.comhttps://www.fda.gov/media/138095/download  This test is not yet approved or cleared by the [place] FDA and  has been authorized for detection and/or diagnosis of SARS-CoV-2 by FDA under an Emergency Use Authorization (EUA). This EUA will remain  in effect (meaning this test can be used) for the duration of the COVID-19 declaration under Se ction 564(b)(1) of the Act, 21 U.S.C. section 360bbb-3(b)(1), unless the authorization is terminated or revoked  sooner.  Performed at [facility], [address] [place], [place] [address]   Surgical PCR screen     Status: None   Collection Time: 09/02/20  4:40 PM   Specimen: Nasal Mucosa; Nasal Swab  Result Value Ref Range Status   MRSA, PCR NEGATIVE NEGATIVE Final   Staphylococcus aureus NEGATIVE NEGATIVE Final    Comment: (NOTE) The Xpert SA Assay (FDA approved for NASAL specimens in patients 64 years of age and older), is one component of a comprehensive surveillance program. It is not intended to diagnose infection nor to guide or monitor treatment. Performed at [facility], [address] [place], [place] [address]           Radiology Studies: DG Abd 1 View  Result Date: 09/05/2020 CLINICAL DATA:  Follow-up RIGHT LOWER QUADRANT abdominal pain. EXAM: ABDOMEN - 1 VIEW COMPARISON:  06/02/2020 FINDINGS: Bowel gas pattern is nonobstructive. Surgical clips are present in the RIGHT UPPER QUADRANT of the abdomen. Known LEFT LOWER QUADRANT hernia is not well seen. IMPRESSION: Nonobstructive bowel gas pattern. Electronically Signed   By: Norva PavlovElizabeth  Brown M.D.   On: 09/05/2020 10:57   DG CHEST PORT 1 VIEW  Result Date: 09/05/2020 CLINICAL DATA:  Follow-up. RIGHT LOWER QUADRANT abdominal pain. History of asthma. EXAM: PORTABLE CHEST 1 VIEW COMPARISON:  08/31/2020 FINDINGS: Stable cardiomegaly. Lungs are free of focal consolidations and pleural effusions. There is no pulmonary edema. IMPRESSION: Stable cardiomegaly. Electronically Signed   By: Norva PavlovElizabeth  Brown M.D.   On: 09/05/2020 10:55        Scheduled Meds:  amLODipine  5 mg Oral Daily   cholecalciferol  2,000 Units Oral Daily   docusate sodium  100 mg Oral BID   enoxaparin (LOVENOX) injection  40 mg Subcutaneous Q24H   [START ON 09/07/2020] gabapentin  400 mg Oral QHS   insulin aspart  0-9 Units Subcutaneous Q4H   irbesartan  300 mg Oral Daily   lactulose  20 g Oral BID   tiZANidine  4 mg Oral QHS   vitamin B-12  500  mcg Oral Daily   Vitamin D (Ergocalciferol)  50,000 Units Oral Q7 days   Continuous Infusions:     LOS: 5 days       Kathlen Mody, MD [facility]   To contact the attending provider between 7A-7P or the covering provider during after hours 7P-7A, please log into the web site [email or web address] and access using universal [place] password for that web site. If you do not have the password, please call the hospital operator.  09/06/2020, 1:02 PM

## 2020-09-06 NOTE — Progress Notes (Signed)
D/c tele per order. Called report to [place], Charity fundraiser at [employer]. Pt going to rm 4N 32.  Lawson Radar, RN

## 2020-09-06 NOTE — [place] FL2 (Signed)
[place] MEDICAID FL2 LEVEL OF CARE SCREENING TOOL     IDENTIFICATION  Patient Name: Rebecca Kemp Birthdate: 27-Apr-1955 Sex: female Admission Date (Current Location): 09/01/2020  [facility] and [place] Number:  Producer, television/film/video and Address:  [facility], [address] [place], [place] [address]      Provider Number: [id number]  Attending Physician Name and Address:  Kathlen Mody, MD  Relative Name and Phone Number:  Krystal [phone]    Current Level of Care: Hospital Recommended Level of Care: Skilled Nursing Facility Prior Approval Number:    Date Approved/Denied:   PASRR Number: [id number]  Discharge Plan: SNF    Current Diagnoses: Patient Active Problem List   Diagnosis Date Noted   Femur fracture (HCC) 09/02/2020   Acute respiratory failure (HCC) 09/02/2020   Encephalopathy 09/02/2020   Essential hypertension 09/02/2020   Type 2 diabetes mellitus (HCC) 09/02/2020    Orientation RESPIRATION BLADDER Height & Weight     Self, Time, Situation, Place  O2 (2L) Incontinent, External catheter Weight: (!) 432 lb (196 kg) Height:  5\' 11"  (180.3 cm)  BEHAVIORAL SYMPTOMS/MOOD NEUROLOGICAL BOWEL NUTRITION STATUS      Continent Diet (See discharge summary)  AMBULATORY STATUS COMMUNICATION OF NEEDS Skin   Extensive Assist Verbally Surgical wounds (right leg)                       Personal Care Assistance Level of Assistance  Feeding, Bathing, Dressing Bathing Assistance: Maximum assistance Feeding assistance: Limited assistance Dressing Assistance: Maximum assistance     Functional Limitations Info  Sight, Hearing, Speech Sight Info: Adequate Hearing Info: Adequate Speech Info: Adequate    SPECIAL CARE FACTORS FREQUENCY  PT (By licensed PT), OT (By licensed OT)     PT Frequency: 5x per week OT Frequency: 5x per week            Contractures Contractures Info: Not present    Additional Factors Info  Code Status,  Allergies, Insulin Sliding Scale Code Status Info: Full Allergies Info: Iodinated Diagnostic Agents, Metformin, Morphine   Insulin Sliding Scale Info: insulin aspart (novoLOG) injection 0-9 Units   every 4 hours       Current Medications (09/06/2020):  This is the current hospital active medication list Current Facility-Administered Medications  Medication Dose Route Frequency Provider Last Rate Last Admin   acetaminophen (TYLENOL) tablet 650 mg  650 mg Oral Q6H PRN 11/07/2020, PA-C       Or   acetaminophen (TYLENOL) suppository 650 mg  650 mg Rectal Q6H PRN Despina Hidden A, PA-C       albuterol (PROVENTIL) (2.5 MG/3ML) 0.083% nebulizer solution 2.5 mg  2.5 mg Inhalation Q4H PRN Ulyses Southward, MD       amLODipine (NORVASC) tablet 5 mg  5 mg Oral Daily Kathlen Mody, MD       cholecalciferol (VITAMIN D3) tablet 2,000 Units  2,000 Units Oral Daily Kathlen Mody, PA-C   2,000 Units at 09/06/20 1020   diphenhydrAMINE (BENADRYL) 12.5 MG/5ML elixir 12.5-25 mg  12.5-25 mg Oral Q4H PRN 04-06-1984, PA-C       docusate sodium (COLACE) capsule 100 mg  100 mg Oral BID Despina Hidden A, PA-C   100 mg at 09/05/20 0937   enoxaparin (LOVENOX) injection 40 mg  40 mg Subcutaneous Q24H 11/06/20 A, PA-C   40 mg at 09/06/20 1020   [START ON 09/07/2020] gabapentin (NEURONTIN) capsule  400 mg  400 mg Oral QHS Kathlen Mody, MD       insulin aspart (novoLOG) injection 0-9 Units  0-9 Units Subcutaneous Q4H Despina Hidden, PA-C   1 Units at 09/06/20 0900   irbesartan (AVAPRO) tablet 300 mg  300 mg Oral Daily Kathlen Mody, MD   300 mg at 09/06/20 1020   lactulose (CHRONULAC) 10 GM/15ML solution 20 g  20 g Oral BID Despina Hidden, PA-C   20 g at 09/05/20 1829   metoCLOPramide (REGLAN) tablet 5-10 mg  5-10 mg Oral Q8H PRN Despina Hidden, PA-C       Or   metoCLOPramide (REGLAN) injection 5-10 mg  5-10 mg Intravenous Q8H PRN Despina Hidden, PA-C       ondansetron [facility]) tablet 4 mg  4 mg Oral Q6H PRN  Despina Hidden, PA-C       Or   ondansetron (ZOFRAN) injection 4 mg  4 mg Intravenous Q6H PRN Despina Hidden, PA-C       oxyCODONE-acetaminophen (PERCOCET/ROXICET) 5-325 MG per tablet 1-2 tablet  1-2 tablet Oral Q4H PRN Despina Hidden, PA-C   2 tablet at 09/06/20 1020   polyethylene glycol (MIRALAX / GLYCOLAX) packet 17 g  17 g Oral Daily PRN Ulyses Southward A, PA-C       tiZANidine (ZANAFLEX) tablet 4 mg  4 mg Oral QHS Kathlen Mody, MD       vitamin B-12 (CYANOCOBALAMIN) tablet 500 mcg  500 mcg Oral Daily Kathlen Mody, MD   500 mcg at 09/06/20 1020   Vitamin D (Ergocalciferol) (DRISDOL) capsule 50,000 Units  50,000 Units Oral Q7 days Kathlen Mody, MD   50,000 Units at 09/05/20 1350     Discharge Medications: Please see discharge summary for a list of discharge medications.  Relevant Imaging Results:  Relevant Lab Results:   Additional Information Pt has been vaccinnated SSN# [id number]  Patrice Paradise, LCSW

## 2020-09-06 NOTE — Progress Notes (Signed)
Patient declined BIPAP use at this time. Currently on 2L [place]. Patient aware to call for RT is she wishes to use CPAP.

## 2020-09-07 ENCOUNTER — Encounter (HOSPITAL_COMMUNITY): Payer: Self-pay | Admitting: Internal Medicine

## 2020-09-07 LAB — GLUCOSE, CAPILLARY
Glucose-Capillary: 157 mg/dL — ABNORMAL HIGH (ref 70–99)
Glucose-Capillary: 133 mg/dL — ABNORMAL HIGH (ref 70–99)
Glucose-Capillary: 145 mg/dL — ABNORMAL HIGH (ref 70–99)
Glucose-Capillary: 143 mg/dL — ABNORMAL HIGH (ref 70–99)
Glucose-Capillary: 176 mg/dL — ABNORMAL HIGH (ref 70–99)

## 2020-09-07 NOTE — Progress Notes (Signed)
PROGRESS NOTE    Rebecca Kemp  NOB:[id number] DOB: 04/08/1955 DOA: 09/01/2020 PCP: Pcp, No    No chief complaint on file.   Brief Narrative:  Rebecca Kemp is a 65 y.o. female with medical history significant of hypertension, asthma, type 2 diabetes, GERD presented to [facility] ED via EMS for evaluation of right leg injury and inability to ambulate after a mechanical fall at a grocery store. Pt seen and examined at bedside.   Patient was seen by orthopedics at [facility] who recommended transfer to a tertiary care center for trauma specialist.  Patient was placed in a long leg splint with 45 degrees of knee flexion.  ED physician spoke to Dr. Eulah Pont with orthopedics at [facility] who agreed to see the patient in consultation.  Patient had also complained of left rib pain, left hand pain, and right hip pain.  X-rays of left ribs and chest, left hand, and right hip/pelvis negative for acute finding.  pt was started on BIPAP as she was found to be altered mental status and elevated co2  on ABG.   Orthopedics consulted, she underwent -Open reduction internal fixation of right intra-articular distal femur fracture on 09/03/20/   Pt seen and examined at bedside, she is alert and oriented at this time. No new complaints. Wants to know if she can go home instead of SNF.   Assessment & Plan:   Principal Problem:   Femur fracture (HCC) Active Problems:   Acute respiratory failure (HCC)   Encephalopathy   Essential hypertension   Type 2 diabetes mellitus (HCC)   Acute metabolic encephalopathy:  Probably secondary to hypercarbic respiratory failure, pain meds. No focal deficits.  Back to baseline at this time. She is alert and oriented.  ABG shows improvement in PH to 7.35, and improvement of pCO2 to 56.  Couldn't do the MRI brain as pt could not tolerate it.    Acute femur fracture post fall Orthopedics consulted, she underwent -Open reduction internal fixation of right  intra-articular distal femur fracture on 09/03/20, therapy eval recommending SNF.  Pain well controlled.  No new changes.     Hypertension:  Well controlled.    Type 2 DM; non insulin dependent.  CBG (last 3)  Recent Labs    09/07/20 0349 09/07/20 0742 09/07/20 1131  GLUCAP 157* 133* 145*    Resume SSI, no change in meds.  Hemoglobin A1c is 6.8%   Acute hypercarbic respiratory failure / OHS/ OSA,  As per the daughter , she was on CPAP previously about 10 years ago, but current not using it.  She will need CPAP prior to discharge. She will need sleep study as outpatient and will need referral to pulmonology for sleep study.  Initially requiring BIPAP, currently on RA. Recommend to wean her off oxygen.     Stage 3b CKD:  Creatinine on admission is 1.8, improved to 1.3, continue to monitor.     Diabetic neuropathy:  Resume gabapentin qhs as reported..   Anemia:  ?  Chronic disease, normocytic anemia.  Anemia of acute blood loss from the surgery.  Hemoglobin on admission is 10, not sure what her baseline hemoglobin is.  Dropped to 9 and stable around 9.  Low vit b12 levels supplementation ordered.  Continue to monitor.    Body mass index is 60.25 kg/m. Morbid obesity Recommend outpatient follow up with PCP regarding referral to bariatric surgery.      DVT prophylaxis: Lovenox Code Status: full code Family  Communication: family at bedside.  Disposition:   Status is: Inpatient  Remains inpatient appropriate because:Ongoing diagnostic testing needed not appropriate for outpatient work up and Unsafe d/c plan  Dispo: The patient is from: Home              Anticipated d/c is to:  pending              Patient currently is not medically stable to d/c.   Difficult to place patient No       Consultants:  orthopedics  Procedures: -Open reduction internal fixation of right intra-articular distal femur fracture  Antimicrobials:  Antibiotics Given (last 72  hours)     None         Subjective: No chest pain or sob.   Objective: Vitals:   09/06/20 2026 09/07/20 0527 09/07/20 0848 09/07/20 1527  BP: (!) 132/49 133/78 (!) 130/48 (!) 135/50  Pulse: 98 78 82 84  Resp: 20 17 16 16   Temp: 98.7 F (37.1 C) 98 F (36.7 C) 98 F (36.7 C) 98.4 F (36.9 C)  TempSrc: Oral  Oral Oral  SpO2: 97% 99% 99% 97%  Weight:      Height:        Intake/Output Summary (Last 24 hours) at 09/07/2020 1644 Last data filed at 09/07/2020 1313 Gross per 24 hour  Intake --  Output 500 ml  Net -500 ml    Filed Weights   09/02/20 0025  Weight: (!) 196 kg    Examination:  General exam: Morbidly obese elderly woman, not in distress.  Respiratory system: clear to auscultation, no wheezing heard.  Cardiovascular system: S1-S2 heard,RRR, no JVD, no pedal edema.  Gastrointestinal system: Abdomen is soft NT ND BS+ Central nervous system: Alert and oriented, non focal  Extremities: No pedal edema Skin: No rashes seen Psychiatry: Mood is appropriate    Data Reviewed: I have personally reviewed following labs and imaging studies  CBC: Recent Labs  Lab 09/03/20 0054 09/03/20 2027 09/04/20 0156 09/05/20 0101  WBC 8.9 10.4 8.7 7.2  NEUTROABS 6.2  --   --   --   HGB 10.2* 9.9* 9.1* 9.4*  HCT 32.4* 31.1* 28.4* 29.8*  MCV 97.0 95.7 96.3 96.1  PLT 209 196 201 191     Basic Metabolic Panel: Recent Labs  Lab 09/03/20 0054 09/03/20 1927 09/04/20 0156 09/05/20 0101 09/06/20 0134  NA 140  --  143 142 140  K 4.5  --  4.4 4.4 4.0  CL 103  --  106 105 104  CO2 31  --  33* 31 27  GLUCOSE 147*  --  145* 152* 135*  BUN 38*  --  33* 30* 30*  CREATININE 1.84* 1.43* 1.32* 1.13* 1.29*  CALCIUM 9.3  --  9.0 9.2 9.1     GFR: Estimated Creatinine Clearance: 85.2 mL/min (A) (by C-G formula based on SCr of 1.29 mg/dL (H)).  Liver Function Tests: Recent Labs  Lab 09/03/20 0054  AST 13*  ALT 13  ALKPHOS 74  BILITOT 0.8  PROT 5.7*  ALBUMIN 2.5*      CBG: Recent Labs  Lab 09/06/20 1927 09/06/20 2337 09/07/20 0349 09/07/20 0742 09/07/20 1131  GLUCAP 141* 166* 157* 133* 145*      Recent Results (from the past 240 hour(s))  SARS CORONAVIRUS 2 (TAT 6-24 HRS) Nasopharyngeal Nasopharyngeal Swab     Status: None   Collection Time: 09/02/20  3:20 AM   Specimen: Nasopharyngeal Swab  Result Value Ref  Range Status   SARS Coronavirus 2 NEGATIVE NEGATIVE Final    Comment: (NOTE) SARS-CoV-2 target nucleic acids are NOT DETECTED.  The SARS-CoV-2 RNA is generally detectable in upper and lower respiratory specimens during the acute phase of infection. Negative results do not preclude SARS-CoV-2 infection, do not rule out co-infections with other pathogens, and should not be used as the sole basis for treatment or other patient management decisions. Negative results must be combined with clinical observations, patient history, and epidemiological information. The expected result is Negative.  Fact Sheet for Patients: HairSlick.no  Fact Sheet for Healthcare Providers: quierodirigir.com  This test is not yet approved or cleared by the [place] FDA and  has been authorized for detection and/or diagnosis of SARS-CoV-2 by FDA under an Emergency Use Authorization (EUA). This EUA will remain  in effect (meaning this test can be used) for the duration of the COVID-19 declaration under Se ction 564(b)(1) of the Act, 21 U.S.C. section 360bbb-3(b)(1), unless the authorization is terminated or revoked sooner.  Performed at [facility], [address] [place], [place] [address]   Surgical PCR screen     Status: None   Collection Time: 09/02/20  4:40 PM   Specimen: Nasal Mucosa; Nasal Swab  Result Value Ref Range Status   MRSA, PCR NEGATIVE NEGATIVE Final   Staphylococcus aureus NEGATIVE NEGATIVE Final    Comment: (NOTE) The Xpert SA Assay (FDA approved for NASAL  specimens in patients 2 years of age and older), is one component of a comprehensive surveillance program. It is not intended to diagnose infection nor to guide or monitor treatment. Performed at [facility], [address], [place], [place] [address]           Radiology Studies: No results found.      Scheduled Meds:  amLODipine  5 mg Oral Daily   cholecalciferol  2,000 Units Oral Daily   docusate sodium  100 mg Oral BID   enoxaparin (LOVENOX) injection  40 mg Subcutaneous Q24H   gabapentin  400 mg Oral QHS   insulin aspart  0-9 Units Subcutaneous Q4H   irbesartan  300 mg Oral Daily   lactulose  20 g Oral BID   tiZANidine  4 mg Oral QHS   vitamin B-12  500 mcg Oral Daily   Vitamin D (Ergocalciferol)  50,000 Units Oral Q7 days   Continuous Infusions:     LOS: 6 days       Kathlen Mody, MD [facility]   To contact the attending provider between 7A-7P or the covering provider during after hours 7P-7A, please log into the web site [email or web address] and access using universal [place] password for that web site. If you do not have the password, please call the hospital operator.  09/07/2020, 4:44 PM

## 2020-09-07 NOTE — TOC Progression Note (Signed)
Transition of Care [facility]) - Progression Note    Patient Details  Name: Rebecca Kemp MRN: [id number] Date of Birth: Jul 07, 1955  Transition of [facility]) CM/SW Contact  Carley Hammed, [place] Phone Number: 09/07/2020, 9:39 AM  Clinical Narrative:    CSW provided bed offers to pt at bedside. She was concerned by the ratings and CSW reminded her that her insurance provides limited options. Questions were answered and she stated she would like to go over it with her daughter. She said her daughter may just want to bring her home instead. CSW asked if she could call to discuss with dtr, but pt noted dtr is unavailable today. SW will continue to follow for disposition and DC needs.   Expected Discharge Plan: Skilled Nursing Facility Barriers to Discharge: Continued Medical Work up, SNF Pending bed offer, English as a second language teacher  Expected Discharge Plan and Services Expected Discharge Plan: Skilled Nursing Facility       Living arrangements for the past 2 months: Mobile Home                                       Social Determinants of Health (SDOH) Interventions    Readmission Risk Interventions No flowsheet data found.

## 2020-09-07 NOTE — Progress Notes (Signed)
Physical Therapy Treatment Patient Details Name: Rebecca Kemp MRN: [id number] DOB: [birth year] Today's Date: 09/07/2020    History of Present Illness pt is a 65 y/o female admitted 6/28 after suffering a mechanical fall at the grocery store.  Pt s/p ORIF of right intra-articular distal femur fracture.  Pt presently experiencing some encephalopathy, attempted MRI 7/1 but couldn't do the MRI brain as pt could not tolerate it.  PMHx:  HTN, DM2.    PT Comments    Pt received in supine, agreeable to therapy session and with good participation and fair tolerance for functional mobility tasks. Pt with poor effort vs cognition limiting standing attempts but did tolerate transition to edge of bed better this date and needed up to modA for bed mobility. Pt unable to stand with totalA +2 physical assist and pt would require mechanical lift and at least +2 assist for transfers to chair at this time. Pt with decreased insight into condition/abilities and unsafe following of commands at times but participatory as able. Pt continues to benefit from PT services to progress toward functional mobility goals.    Follow Up Recommendations  SNF;Supervision/Assistance - 24 hour     Equipment Recommendations  Other (comment) (if home, likely will need mechanical lift and hospital bed and bariatric wheelchair, will continue to assess pending progress, need to clarify home setup with family)    Recommendations for Other Services       Precautions / Restrictions Precautions Precautions: Fall Restrictions Weight Bearing Restrictions: Yes RLE Weight Bearing: Touchdown weight bearing    Mobility  Bed Mobility Overal bed mobility: Needs Assistance Bed Mobility: Rolling;Sidelying to Sit;Sit to Supine Rolling: Mod assist Sidelying to sit: +2 for physical assistance;Mod assist;HOB elevated   Sit to supine: +2 for physical assistance;Max assist;+2 for safety/equipment   General bed mobility comments: multimodal  cues for sequencing, use of bed features/rail, increased time to perform    Transfers Overall transfer level: Needs assistance Equipment used: Rolling walker (2 wheeled) Transfers: Sit to/from Stand;Lateral/Scoot Transfers Sit to Stand: Total assist;From elevated surface;+2 physical assistance        Lateral/Scoot Transfers: Total assist;+2 physical assistance General transfer comment: attempted x3 trials from EOB, initial attempt no visible effort from LLE/LUE; second attempt, pt totalA to raise hips 1-2" from EOB and same for third attempt; likely will need mechanical lift for OOB transfers; pt needs hand over hand assist and use of momentum strategy with bed height elevated for even partial lifting of hips; attempted seated scooting but limited to no movement achieved  Ambulation/Gait                 Stairs             Wheelchair Mobility    Modified Rankin (Stroke Patients Only)       Balance Overall balance assessment: Needs assistance Sitting-balance support: Bilateral upper extremity supported;Feet supported Sitting balance-Leahy Scale: Fair Sitting balance - Comments: pt needs feet blocked and at times knees/hips blocked with tactile cueing as she impulsively scoots toward EOB putting her at increased risk of falls; pt progressed to min guard/supervision for seated balance ~8 minutes       Standing balance comment: unable to achieve upright x3 trials with +2 maxA at [employer] Arousal/Alertness: Awake/alert Behavior During Therapy: Restless Overall Cognitive Status: Impaired/Different from baseline Area  of Impairment: Attention;Following commands;Safety/judgement;Awareness;Problem solving;Memory                 Orientation Level: Time Current Attention Level: Sustained Memory: Decreased recall of precautions;Decreased short-term memory Following Commands: Follows one step commands consistently;Follows  one step commands with increased time;Follows multi-step commands inconsistently Safety/Judgement: Decreased awareness of safety;Decreased awareness of deficits Awareness: Intellectual;Emergent Problem Solving: Difficulty sequencing;Requires verbal cues;Requires tactile cues;Slow processing General Comments: pt receptive to instructions but when cued to push from EOB for standing attempts, pt with minimal effort, tending to instead reach up and pull backward on RW handles; difficult following 2-step commands; alert and participatory as able, unsure of baseline cognition but pt seeming to have decreased insight into deficits. Pt also keeps scooting forward to EOB although therapist requesting her to scoot back further onto bed.      Exercises Other Exercises Other Exercises: supine BLE AROM: heel slides (LLE only), AAROM hip abduction, ankle pumps, glute sets, quad sets x5-10 reps ea Other Exercises: seated BUE AROM: chest press, arm "bicycle" x15 reps ea    General Comments General comments (skin integrity, edema, etc.): BP 120/56 supine, BP 146/87 seated (no dizziness), HR 96 bpm and SpO2 93-96% on 2L O2 [place]      Pertinent Vitals/Pain Pain Assessment: Faces Faces Pain Scale: Hurts whole lot Pain Location: R LE Pain Descriptors / Indicators: Grimacing;Guarding;Sharp;Sore Pain Intervention(s): Limited activity within patient's tolerance;Monitored during session;Repositioned;RN gave pain meds during session    Home Living                      Prior Function            PT Goals (current goals can now be found in the care plan section) Acute Rehab PT Goals Patient Stated Goal: to go home PT Goal Formulation: Patient unable to participate in goal setting Time For Goal Achievement: 09/18/20 Progress towards PT goals: Progressing toward goals    Frequency    Min 4X/week      PT Plan Current plan remains appropriate    Co-evaluation              AM-PAC PT "6  Clicks" Mobility   Outcome Measure  Help needed turning from your back to your side while in a flat bed without using bedrails?: A Lot Help needed moving from lying on your back to sitting on the side of a flat bed without using bedrails?: A Lot Help needed moving to and from a bed to a chair (including a wheelchair)?: Total Help needed standing up from a chair using your arms (e.g., wheelchair or bedside chair)?: Total Help needed to walk in hospital room?: Total Help needed climbing 3-5 steps with a railing? : Total 6 Click Score: 8    End of Session Equipment Utilized During Treatment: Gait belt Activity Tolerance: Patient tolerated treatment well;Patient limited by fatigue;Other (comment) (unable to stand; mild confusion with mobility?) Patient left: in bed;with call bell/phone within reach;with bed alarm set;with family/visitor present (grandaughter and gd sig other present in room; heels floated, bed in chair position) Nurse Communication: Mobility status;Need for lift equipment;Precautions (family asking about reason for dressing on her R posterior hip region) PT Visit Diagnosis: Other abnormalities of gait and mobility (R26.89);Muscle weakness (generalized) (M62.81);Pain Pain - Right/Left: Right Pain - part of body: Leg     Time: 1230-1305 PT Time Calculation (min) (ACUTE ONLY): 35 min  Charges:  $Therapeutic Exercise: 8-22 mins $Therapeutic Activity: 8-22 mins  Rebecca Ou., Rebecca Kemp Acute Rehabilitation Services Pager: [phone] Office: [phone]    Angus Palms 09/07/2020, 5:37 PM

## 2020-09-07 NOTE — Plan of Care (Signed)

## 2020-09-08 DIAGNOSIS — I10 Essential (primary) hypertension: Secondary | ICD-10-CM

## 2020-09-08 LAB — GLUCOSE, CAPILLARY
Glucose-Capillary: 125 mg/dL — ABNORMAL HIGH (ref 70–99)
Glucose-Capillary: 139 mg/dL — ABNORMAL HIGH (ref 70–99)
Glucose-Capillary: 141 mg/dL — ABNORMAL HIGH (ref 70–99)
Glucose-Capillary: 148 mg/dL — ABNORMAL HIGH (ref 70–99)
Glucose-Capillary: 155 mg/dL — ABNORMAL HIGH (ref 70–99)
Glucose-Capillary: 173 mg/dL — ABNORMAL HIGH (ref 70–99)

## 2020-09-08 LAB — COMPREHENSIVE METABOLIC PANEL
ALT: 12 U/L (ref 0–44)
AST: 21 U/L (ref 15–41)
Albumin: 2.3 g/dL — ABNORMAL LOW (ref 3.5–5.0)
Alkaline Phosphatase: 71 U/L (ref 38–126)
Anion gap: 9 (ref 5–15)
BUN: 25 mg/dL — ABNORMAL HIGH (ref 8–23)
CO2: 35 mmol/L — ABNORMAL HIGH (ref 22–32)
Calcium: 9.8 mg/dL (ref 8.9–10.3)
Chloride: 98 mmol/L (ref 98–111)
Creatinine, Ser: 1.17 mg/dL — ABNORMAL HIGH (ref 0.44–1.00)
GFR, Estimated: 52 mL/min — ABNORMAL LOW (ref >60)
Glucose, Bld: 153 mg/dL — ABNORMAL HIGH (ref 70–99)
Potassium: 3.8 mmol/L (ref 3.5–5.1)
Sodium: 142 mmol/L (ref 135–145)
Total Bilirubin: 1 mg/dL (ref 0.3–1.2)
Total Protein: 6.2 g/dL — ABNORMAL LOW (ref 6.5–8.1)

## 2020-09-08 LAB — CBC WITH DIFFERENTIAL/PLATELET
Abs Immature Granulocytes: 0.05 10*3/uL (ref 0.00–0.07)
Basophils Absolute: 0 10*3/uL (ref 0.0–0.1)
Basophils Relative: 1 %
Eosinophils Absolute: 0.1 10*3/uL (ref 0.0–0.5)
Eosinophils Relative: 2 %
HCT: 29.3 % — ABNORMAL LOW (ref 36.0–46.0)
Hemoglobin: 9.4 g/dL — ABNORMAL LOW (ref 12.0–15.0)
Immature Granulocytes: 1 %
Lymphocytes Relative: 35 %
Lymphs Abs: 2.1 10*3/uL (ref 0.7–4.0)
MCH: 30.3 pg (ref 26.0–34.0)
MCHC: 32.1 g/dL (ref 30.0–36.0)
MCV: 94.5 fL (ref 80.0–100.0)
Monocytes Absolute: 0.5 10*3/uL (ref 0.1–1.0)
Monocytes Relative: 8 %
Neutro Abs: 3.2 10*3/uL (ref 1.7–7.7)
Neutrophils Relative %: 53 %
Platelets: 294 10*3/uL (ref 150–400)
RBC: 3.1 MIL/uL — ABNORMAL LOW (ref 3.87–5.11)
RDW: 13.3 % (ref 11.5–15.5)
WBC: 5.9 10*3/uL (ref 4.0–10.5)
nRBC: 0 % (ref 0.0–0.2)

## 2020-09-08 NOTE — Progress Notes (Signed)
Physical Therapy Treatment Patient Details Name: Rebecca Kemp MRN: [id number] DOB: 1955/07/21 Today's Date: 09/08/2020    History of Present Illness Pt is a 65 y/o female admitted 6/28 after suffering a mechanical fall at the grocery store.  Pt s/p ORIF of right intra-articular distal femur fracture.  Pt initially with encephalopathy that is improving.  PMHx:  HTN, DM2.    PT Comments    Pt making good progress.  She is oriented and following all commands.  Pt very motivated.  She has support from daughter at home.  At this time pt requiring mod A x 2 for transfers, largely limited due to TDWB.  She was able to get to chair today with assist.  SNF initially recommended - however pt reports cannot afford copays.  Considering her improved cognition, good progress, rehab potential, and motivation - updated recommendation to CIR.  Strongly encouraged pt to consider some form of rehab due to level of assist required at this time -would be very difficult/unsafe to attempt home without furthering mobility first.     Follow Up Recommendations  CIR     Equipment Recommendations  Hospital bed;Other (comment) (Bariatric w/c with drop arm; 3 in 1 bariatric with drop arm; hospital bed; hoyer lift; bariatric RW)    Recommendations for Other Services       Precautions / Restrictions Precautions Precautions: Fall Restrictions RLE Weight Bearing: Touchdown weight bearing    Mobility  Bed Mobility Overal bed mobility: Needs Assistance Bed Mobility: Supine to Sit     Supine to sit: Mod assist;+2 for safety/equipment     General bed mobility comments: Increased time for supine to sit with assist for R leg, use of rails, and hand to lift trunk; mod A x 2 for safety    Transfers Overall transfer level: Needs assistance Equipment used: Rolling walker (2 wheeled) Transfers: Sit to/from Stand;Lateral/Scoot Transfers Sit to Stand: Mod assist;+2 physical assistance        Lateral/Scoot  Transfers: Max assist;+2 physical assistance General transfer comment: Performed sit to stand from elevated EOB with mod x 2 to rise and tech stabilized RW.  First attempt only partial stand, 2nd attempt able to stand but having difficulty with TDWB so returned to sitting. Then performed lateral scoot to chair with bed elevated and tilted down to facilitate scoot to chair - pt assisting but required max x 2 for transfer to chair.  Ambulation/Gait                 Stairs             Wheelchair Mobility    Modified Rankin (Stroke Patients Only)       Balance Overall balance assessment: Needs assistance Sitting-balance support: Feet supported;No upper extremity supported Sitting balance-Leahy Scale: Good     Standing balance support: Bilateral upper extremity supported Standing balance-Leahy Scale: Poor Standing balance comment: requiring RW                            Cognition Arousal/Alertness: Awake/alert Behavior During Therapy: WFL for tasks assessed/performed Overall Cognitive Status: Within Functional Limits for tasks assessed                                 General Comments: Cognition significantly improved.  Following all commands, alert and oriented x4      Exercises General Exercises - Lower Extremity  Ankle Circles/Pumps: AROM;Both;10 reps;Seated Quad Sets: AROM;Left;10 reps;Supine Long Arc Quad: AAROM;Right;10 reps;Seated Heel Slides: AAROM;Right;10 reps;Supine Hip ABduction/ADduction: AAROM;10 reps;Right;Supine Other Exercises Other Exercises: Cues for controlled motions and correct exercise motion - pt reports feels good to move    General Comments General comments (skin integrity, edema, etc.): VSS.  Pt reports she cannot afford copay for SNF and plans to go home with daughter.  Discussed this would be very difficulty and with safety issues considering steps to enter home and level of assist pt currently requiring.  Did  discuss equipement that would be needed if returned home; however, also mentioned CIR potentially could be option but not guarenteed. Pt willing to consider CIR if possible.      Pertinent Vitals/Pain Pain Assessment: Faces Faces Pain Scale: Hurts little more Pain Location: R LE - with transfers; felt better with exercises Pain Descriptors / Indicators: Grimacing;Guarding Pain Intervention(s): Limited activity within patient's tolerance;Monitored during session;Repositioned    Home Living                      Prior Function            PT Goals (current goals can now be found in the care plan section) Acute Rehab PT Goals Patient Stated Goal: to go home PT Goal Formulation: With patient Time For Goal Achievement: 09/18/20 Potential to Achieve Goals: Good Progress towards PT goals: Progressing toward goals    Frequency    Min 4X/week      PT Plan Discharge plan needs to be updated;Equipment recommendations need to be updated    Co-evaluation PT/OT/SLP Co-Evaluation/Treatment: Yes Reason for Co-Treatment: Complexity of the patient's impairments (multi-system involvement);For patient/therapist safety PT goals addressed during session: Mobility/safety with mobility;Strengthening/ROM OT goals addressed during session: ADL's and self-care      AM-PAC PT "6 Clicks" Mobility   Outcome Measure  Help needed turning from your back to your side while in a flat bed without using bedrails?: A Lot Help needed moving from lying on your back to sitting on the side of a flat bed without using bedrails?: A Lot Help needed moving to and from a bed to a chair (including a wheelchair)?: Total Help needed standing up from a chair using your arms (e.g., wheelchair or bedside chair)?: Total Help needed to walk in hospital room?: Total Help needed climbing 3-5 steps with a railing? : Total 6 Click Score: 8    End of Session Equipment Utilized During Treatment: Gait belt Activity  Tolerance: Patient tolerated treatment well Patient left: in chair;with chair alarm set;with call bell/phone within reach Nurse Communication: Mobility status;Need for lift equipment (maxi sky pad under pt) PT Visit Diagnosis: Other abnormalities of gait and mobility (R26.89);Muscle weakness (generalized) (M62.81);Pain Pain - Right/Left: Right Pain - part of body: Leg     Time: 1638-4665 PT Time Calculation (min) (ACUTE ONLY): 48 min  Charges:  $Therapeutic Exercise: 8-22 mins $Therapeutic Activity: 8-22 mins                     Anise Salvo, PT Acute Rehab Services Pager [phone] [facility] [phone]    Rayetta Humphrey 09/08/2020, 1:20 PM

## 2020-09-08 NOTE — Plan of Care (Signed)

## 2020-09-08 NOTE — Care Management Important Message (Signed)
Important Message  Patient Details  Name: Rebecca Kemp MRN: [id number] Date of Birth: [birth year]   Medicare Important Message Given:  Yes     Oralia Rud Monika Chestang 09/08/2020, 3:42 PM

## 2020-09-08 NOTE — Progress Notes (Signed)
Occupational Therapy Treatment Patient Details Name: Rebecca Kemp MRN: [id number] DOB: 02/06/1956 Today's Date: 09/08/2020    History of present illness Pt is a 65 y/o female admitted 6/28 after suffering a mechanical fall at the grocery store.  Pt s/p ORIF of right intra-articular distal femur fracture.  Pt initially with encephalopathy that is improving.  PMHx:  HTN, DM2.   OT comments  Rebecca Kemp is make good progress towards her goals. She was able to complete some ADLs at bed level this session given set up A. She attempted sit<>stand 2x with mod A to boost into standing and increased time, she does not have great standing tolerance and difficulty maintaining WB status therefore pt sat back down. Pt was max +2 to lateral scoot with slide board from bed>chair. At the end of the session, pt brought up concern for copay at SNF and stated "I am going home." Concerns with going home at her current level were discussed with the pt, she understood. Pt interested in pursuing CIR to maximize indep prior to d/c home.    Follow Up Recommendations  Supervision/Assistance - 24 hour;CIR    Equipment Recommendations  Wheelchair (measurements OT);Wheelchair cushion (measurements OT);Hospital bed       Precautions / Restrictions Precautions Precautions: Fall Restrictions Weight Bearing Restrictions: Yes RLE Weight Bearing: Touchdown weight bearing       Mobility Bed Mobility Overal bed mobility: Needs Assistance Bed Mobility: Supine to Sit Rolling: Mod assist Sidelying to sit: +2 for physical assistance;Mod assist;HOB elevated Supine to sit: Mod assist;+2 for safety/equipment     General bed mobility comments: Increased time for supine to sit with assist for R leg, use of rails, and hand to lift trunk; mod A x 2 for safety    Transfers Overall transfer level: Needs assistance Equipment used: Rolling walker (2 wheeled) Transfers: Sit to/from Stand;Lateral/Scoot Transfers Sit to Stand: Mod  assist;+2 physical assistance        Lateral/Scoot Transfers: Max assist;+2 physical assistance General transfer comment: Performed sit to stand from elevated EOB with mod x 2 to rise and tech stabilized RW.  First attempt only partial stand, 2nd attempt able to stand but having difficulty with TDWB so returned to sitting. Then performed lateral scoot to chair with bed elevated and tilted down to facilitate scoot to chair - pt assisting but required max x 2 for transfer to chair.    Balance Overall balance assessment: Needs assistance Sitting-balance support: Feet supported;No upper extremity supported Sitting balance-Leahy Scale: Good     Standing balance support: Bilateral upper extremity supported Standing balance-Leahy Scale: Poor Standing balance comment: requiring RW             ADL either performed or assessed with clinical judgement   ADL Overall ADL's : Needs assistance/impaired     Grooming: Set up;Sitting   Upper Body Bathing: Set up;Sitting   Lower Body Bathing: Moderate assistance;+2 for physical assistance;+2 for safety/equipment;Sit to/from stand           Functional mobility during ADLs: Maximal assistance;+2 for physical assistance;+2 for safety/equipment General ADL Comments: pt able to bathe frontal perineal, chest and proximal legs while sitting EOB.     Vision   Vision Assessment?: No apparent visual deficits          Cognition Arousal/Alertness: Awake/alert Behavior During Therapy: WFL for tasks assessed/performed Overall Cognitive Status: Within Functional Limits for tasks assessed           General Comments: Cognition significantly improved.  Following all commands, alert and oriented x4        Exercises General Exercises - Lower Extremity Ankle Circles/Pumps: AROM;Both;10 reps;Seated Quad Sets: AROM;Left;10 reps;Supine Long Arc Quad: AAROM;Right;10 reps;Seated Heel Slides: AAROM;Right;10 reps;Supine Hip ABduction/ADduction:  AAROM;10 reps;Right;Supine Other Exercises Other Exercises: Cues for controlled motions and correct exercise motion - pt reports feels good to move      General Comments O2 on RA desatted to 87%, placed 2L nasal canula at the end of the session    Pertinent Vitals/ Pain       Pain Assessment: Faces Faces Pain Scale: Hurts little more Pain Location: R LE - with transfers; felt better with exercises Pain Descriptors / Indicators: Grimacing;Guarding Pain Intervention(s): Monitored during session   Frequency  Min 2X/week        Progress Toward Goals  OT Goals(current goals can now be found in the care plan section)  Progress towards OT goals: Progressing toward goals  Acute Rehab OT Goals Patient Stated Goal: to go home OT Goal Formulation: Patient unable to participate in goal setting Time For Goal Achievement: 09/18/20 Potential to Achieve Goals: Fair ADL Goals Pt Will Perform Grooming: with supervision;sitting Pt Will Perform Upper Body Bathing: with min assist;sitting Pt Will Perform Lower Body Bathing: with mod assist;sitting/lateral leans;bed level Additional ADL Goal #1: Pt to demo ability to sit EOB > 5 min with no more than Min A to maintain balance during ADLs Additional ADL Goal #2: Pt to demo bed mobility at Mod A in prep for ADL transfers  Plan Discharge plan remains appropriate    Co-evaluation    PT/OT/SLP Co-Evaluation/Treatment: Yes Reason for Co-Treatment: Complexity of the patient's impairments (multi-system involvement);For patient/therapist safety;To address functional/ADL transfers PT goals addressed during session: Mobility/safety with mobility;Strengthening/ROM OT goals addressed during session: ADL's and self-care;Other (comment) (functional transfers)      AM-PAC OT "6 Clicks" Daily Activity     Outcome Measure   Help from another person eating meals?: None Help from another person taking care of personal grooming?: A Little Help from  another person toileting, which includes using toliet, bedpan, or urinal?: A Lot Help from another person bathing (including washing, rinsing, drying)?: A Lot Help from another person to put on and taking off regular upper body clothing?: A Little Help from another person to put on and taking off regular lower body clothing?: A Lot 6 Click Score: 16    End of Session Equipment Utilized During Treatment: Gait belt;Rolling walker;Oxygen  OT Visit Diagnosis: Unsteadiness on feet (R26.81);Other abnormalities of gait and mobility (R26.89);Muscle weakness (generalized) (M62.81);History of falling (Z91.81);Other symptoms and signs involving cognitive function;Pain Pain - Right/Left: Right Pain - part of body: Leg;Knee   Activity Tolerance Patient tolerated treatment well;Patient limited by pain   Patient Left in chair;with call bell/phone within reach;with chair alarm set   Nurse Communication Mobility status        Time: 9470-9628 OT Time Calculation (min): 34 min  Charges: OT General Charges $OT Visit: 1 Visit OT Treatments $Self Care/Home Management : 8-22 mins     Louise Victory A Syerra Abdelrahman 09/08/2020, 1:37 PM

## 2020-09-08 NOTE — Progress Notes (Signed)
PROGRESS NOTE    Rebecca Kemp  VOH:[id number] DOB: 1955/06/10 DOA: 09/01/2020 PCP: Pcp, No    No chief complaint on file.   Brief Narrative:  Rebecca Kemp is a 65 y.o. female with medical history significant of hypertension, asthma, type 2 diabetes, GERD presented to [facility] ED via EMS for evaluation of right leg injury and inability to ambulate after a mechanical fall at a grocery store.  Patient was seen by orthopedics at [facility] who recommended transfer to a tertiary care center for trauma specialist.  Patient was placed in a long leg splint with 45 degrees of knee flexion.  ED physician spoke to Dr. Eulah Pont with orthopedics at [facility] who agreed to see the patient in consultation.  Patient had also complained of left rib pain, left hand pain, and right hip pain.  X-rays of left ribs and chest, left hand, and right hip/pelvis negative for acute finding.  pt was started on BIPAP as she was found to be altered mental status and elevated co2  on ABG.  she is weaned off Bipap and is currently on 2l it of [place] oxygen.  Orthopedics consulted, she underwent -Open reduction internal fixation of right intra-articular distal femur fracture on 09/03/20.   Pt seen and examined at bedside, she is alert and oriented at this time. No new complaints. Wants to know if she can go home instead of SNF.   Assessment & Plan:   Principal Problem:   Femur fracture (HCC) Active Problems:   Acute respiratory failure (HCC)   Encephalopathy   Essential hypertension   Type 2 diabetes mellitus (HCC)   Acute metabolic encephalopathy:  Probably secondary to hypercarbic respiratory failure, pain meds. No focal deficits.  Back to baseline at this time. She is alert and oriented.  ABG shows improvement in PH to 7.35, and improvement of pCO2 to 56.  Couldn't do the MRI brain as pt could not tolerate it.  Pt will need sleep study as outpatient and probably follow up pulmonology on discharge.     Acute femur fracture  following mechanical fall.  Orthopedics consulted, she underwent -Open reduction internal fixation of right intra-articular distal femur fracture on 09/03/20, therapy eval recommending SNF.  Pain well controlled.  Pt wants to know if she can go home with equipment and home health services, and hospital bed. CSW made aware.     Hypertension:  Well controlled.    Type 2 DM; non insulin dependent.  CBG (last 3)  Recent Labs    09/08/20 0455 09/08/20 0728 09/08/20 1126  GLUCAP 139* 155* 125*    Resume SSI, no change in meds.  Hemoglobin A1c is 6.8%   Acute hypercarbic respiratory failure / OHS/ OSA,  As per the daughter , she was on CPAP previously about 10 years ago, but current not using it.   She will need sleep study as outpatient and will need referral to pulmonology for sleep study.  Initially requiring BIPAP, currently on RA. Recommend to wean her off oxygen.     Stage 3b CKD:  Creatinine on admission is 1.8, improved to 1.1, continue to monitor.     Diabetic neuropathy:  Resume gabapentin qhs as reported..   Anemia:  ?  Chronic disease, normocytic anemia.  Anemia of acute blood loss from the surgery.  Hemoglobin on admission is 10, not sure what her baseline hemoglobin is.  Dropped to 9 and stable around 9.  Low vit b12 levels supplementation ordered.  Continue  to monitor.    Body mass index is 60.25 kg/m. Morbid obesity Recommend outpatient follow up with PCP regarding referral to bariatric surgery.      DVT prophylaxis: Lovenox Code Status: full code Family Communication: None at bedside.  Disposition:   Status is: Inpatient  Remains inpatient appropriate because:Ongoing diagnostic testing needed not appropriate for outpatient work up and Unsafe d/c plan  Dispo: The patient is from: Home              Anticipated d/c is to:  pending              Patient currently is medically stable to d/c.   Difficult to place  patient No       Consultants:  orthopedics  Procedures: -Open reduction internal fixation of right intra-articular distal femur fracture  Antimicrobials:  Antibiotics Given (last 72 hours)     None         Subjective: No new complaints.   Objective: Vitals:   09/07/20 0848 09/07/20 1527 09/07/20 2200 09/08/20 0727  BP: (!) 130/48 (!) 135/50 138/78 (!) 109/48  Pulse: 82 84 80 83  Resp: 16 16 18 16   Temp: 98 F (36.7 C) 98.4 F (36.9 C) 98.1 F (36.7 C) 98.6 F (37 C)  TempSrc: Oral Oral Oral Oral  SpO2: 99% 97% 98% 98%  Weight:      Height:        Intake/Output Summary (Last 24 hours) at 09/08/2020 1134 Last data filed at 09/08/2020 0846 Gross per 24 hour  Intake 240 ml  Output 850 ml  Net -610 ml    Filed Weights   09/02/20 0025  Weight: (!) 196 kg    Examination:  General exam: Morbidly obese lady appears comfortable on 2 L of nasal cannula oxygen Respiratory system: Clear to auscultation bilaterally no wheezing or rhonchi no tachypnea Cardiovascular system: S1-S2 heard, regular rate rhythm, no JVD, no pedal edema Gastrointestinal system: Abdomen is soft nontender nondistended bowel sounds normal Central nervous system: Alert and oriented, non focal  Extremities: Painful range of movements on the right lower extremity, Skin: No rashes seen Psychiatry: Mood is appropriate    Data Reviewed: I have personally reviewed following labs and imaging studies  CBC: Recent Labs  Lab 09/03/20 0054 09/03/20 2027 09/04/20 0156 09/05/20 0101 09/08/20 0339  WBC 8.9 10.4 8.7 7.2 5.9  NEUTROABS 6.2  --   --   --  3.2  HGB 10.2* 9.9* 9.1* 9.4* 9.4*  HCT 32.4* 31.1* 28.4* 29.8* 29.3*  MCV 97.0 95.7 96.3 96.1 94.5  PLT 209 196 201 191 294     Basic Metabolic Panel: Recent Labs  Lab 09/03/20 0054 09/03/20 1927 09/04/20 0156 09/05/20 0101 09/06/20 0134 09/08/20 0339  NA 140  --  143 142 140 142  K 4.5  --  4.4 4.4 4.0 3.8  CL 103  --  106 105 104  98  CO2 31  --  33* 31 27 35*  GLUCOSE 147*  --  145* 152* 135* 153*  BUN 38*  --  33* 30* 30* 25*  CREATININE 1.84* 1.43* 1.32* 1.13* 1.29* 1.17*  CALCIUM 9.3  --  9.0 9.2 9.1 9.8     GFR: Estimated Creatinine Clearance: 93.9 mL/min (A) (by C-G formula based on SCr of 1.17 mg/dL (H)).  Liver Function Tests: Recent Labs  Lab 09/03/20 0054 09/08/20 0339  AST 13* 21  ALT 13 12  ALKPHOS 74 71  BILITOT 0.8 1.0  PROT 5.7* 6.2*  ALBUMIN 2.5* 2.3*     CBG: Recent Labs  Lab 09/07/20 2051 09/08/20 0030 09/08/20 0455 09/08/20 0728 09/08/20 1126  GLUCAP 176* 141* 139* 155* 125*      Recent Results (from the past 240 hour(s))  SARS CORONAVIRUS 2 (TAT 6-24 HRS) Nasopharyngeal Nasopharyngeal Swab     Status: None   Collection Time: 09/02/20  3:20 AM   Specimen: Nasopharyngeal Swab  Result Value Ref Range Status   SARS Coronavirus 2 NEGATIVE NEGATIVE Final    Comment: (NOTE) SARS-CoV-2 target nucleic acids are NOT DETECTED.  The SARS-CoV-2 RNA is generally detectable in upper and lower respiratory specimens during the acute phase of infection. Negative results do not preclude SARS-CoV-2 infection, do not rule out co-infections with other pathogens, and should not be used as the sole basis for treatment or other patient management decisions. Negative results must be combined with clinical observations, patient history, and epidemiological information. The expected result is Negative.  Fact Sheet for Patients: HairSlick.no  Fact Sheet for Healthcare Providers: quierodirigir.com  This test is not yet approved or cleared by the [place] FDA and  has been authorized for detection and/or diagnosis of SARS-CoV-2 by FDA under an Emergency Use Authorization (EUA). This EUA will remain  in effect (meaning this test can be used) for the duration of the COVID-19 declaration under Se ction 564(b)(1) of the Act, 21  U.S.C. section 360bbb-3(b)(1), unless the authorization is terminated or revoked sooner.  Performed at [facility], [address], [place], [place] [address]   Surgical PCR screen     Status: None   Collection Time: 09/02/20  4:40 PM   Specimen: Nasal Mucosa; Nasal Swab  Result Value Ref Range Status   MRSA, PCR NEGATIVE NEGATIVE Final   Staphylococcus aureus NEGATIVE NEGATIVE Final    Comment: (NOTE) The Xpert SA Assay (FDA approved for NASAL specimens in patients 40 years of age and older), is one component of a comprehensive surveillance program. It is not intended to diagnose infection nor to guide or monitor treatment. Performed at [facility], [address] [place], [place] [address]           Radiology Studies: No results found.      Scheduled Meds:  amLODipine  5 mg Oral Daily   cholecalciferol  2,000 Units Oral Daily   docusate sodium  100 mg Oral BID   enoxaparin (LOVENOX) injection  40 mg Subcutaneous Q24H   gabapentin  400 mg Oral QHS   insulin aspart  0-9 Units Subcutaneous Q4H   irbesartan  300 mg Oral Daily   lactulose  20 g Oral BID   tiZANidine  4 mg Oral QHS   vitamin B-12  500 mcg Oral Daily   Vitamin D (Ergocalciferol)  50,000 Units Oral Q7 days   Continuous Infusions:     LOS: 7 days       Kathlen Mody, MD [facility]   To contact the attending provider between 7A-7P or the covering provider during after hours 7P-7A, please log into the web site [email or web address] and access using universal  password for that web site. If you do not have the password, please call the hospital operator.  09/08/2020, 11:34 AM

## 2020-09-08 NOTE — Progress Notes (Signed)
Pt refused BiPAP for the night.

## 2020-09-08 NOTE — Progress Notes (Signed)
Inpatient Rehab Admissions Coordinator Note:   Per therapy recommendations, pt was screened for CIR candidacy by Megan Salon, MS CCC-SLP. At this time, Pt. Appears to have functional decline and is a potential  candidate for CIR. I Will place order for rehab consult per protocol, however, medical team should be aware that insurance may not approve CIR for Pt.'s dx. In addition, Pt. Declined SNF due to inability to afford copays; however, most [facility] do require copays for CIR. Please contact me with any questions.   Megan Salon, MS, CCC-SLP Rehab Admissions Coordinator  [phone] (celll) [phone] (office)

## 2020-09-08 NOTE — Care Management (Signed)
    Durable Medical Equipment  (From admission, onward)           Start     Ordered   09/08/20 1232  For home use only DME Walker rolling  Once       Comments: Bariatric, weight 196 KG, body habitus  Question Answer Comment  Walker: With 5 Inch Wheels   Patient needs a walker to treat with the following condition Weakness      09/08/20 1232   09/08/20 1218  For home use only DME 3 n 1  Once       Comments: Bariatric needed for weight of 196KG and body habitus   09/08/20 1232   09/08/20 1216  For home use only DME standard manual wheelchair with seat cushion  Once       Comments: Patient suffers from fracture which impairs their ability to perform daily activities like bathing, and walking in the home.  A walker will not resolve issue with performing activities of daily living. A wheelchair will allow patient to safely perform daily activities. Patient can safely propel the wheelchair in the home or has a caregiver who can provide assistance. Length of need 6 months . Accessories: elevating leg rests (ELRs), wheel locks, extensions and anti-tippers. Bariatric due to weight of 196KG, and body habitus   09/08/20 1232   09/08/20 1214  For home use only DME Hospital bed  Once       Question Answer Comment  Length of Need 6 Months   Patient has (list medical condition): femur fracture   The above medical condition requires: Patient requires the ability to reposition frequently   Head must be elevated greater than: 30 degrees   Bed type Semi-electric   Hoyer Lift Yes   Support Surface: Gel Overlay      09/08/20 1232

## 2020-09-08 NOTE — TOC Progression Note (Signed)
Transition of Care [facility]) - Progression Note    Patient Details  Name: Rebecca Kemp MRN: [id number] Date of Birth: 1955/08/29  Transition of Care [facility]) CM/SW Contact  Rebecca Sabal, RN Phone Number: 09/08/2020, 12:43 PM  Clinical Narrative:    Sherron Monday w patient at bedside. She states that she cannot afford copay days at [facility] and has arranged to go to her daughter's house at [place].  [address] [place] [address] Rebecca Kemp Daughter [phone]   She states there are a few steps to get in and out and will need nonemergency transport.  DME- ordered Hospital bed w hoyer, bariatric WC, 3/1, RW HH- through [facility] via Rebecca Kemp.  Spoke w Rebecca Kemp who confirmed above, states she has a place for DME, and will be coming to hospital tomorrow to learn how to ambulate and move patient.      Expected Discharge Plan: Home w Home Health Services Barriers to Discharge: Continued Medical Work up  Expected Discharge Plan and Services Expected Discharge Plan: Home w Home Health Services   Discharge Planning Services: CM Consult Post Acute Care Choice: Home Health, Durable Medical Equipment Living arrangements for the past 2 months: Mobile Home                 DME Arranged: 3-N-1, Walker rolling, Hospital bed, Wheelchair manual DME Agency: [employer] Date DME Agency Contacted: 09/08/20 Time DME Agency Contacted: 1242 Representative spoke with at DME Agency: Rebecca Kemp HH Arranged: PT, OT, Nurse's Aide HH Agency: [facility] Date [facility] Agency Contacted: 09/08/20 Time HH Agency Contacted: 1242 Representative spoke with at [facility] Agency: via Rebecca Kemp.   Social Determinants of Health (SDOH) Interventions    Readmission Risk Interventions No flowsheet data found.

## 2020-09-09 DIAGNOSIS — G9340 Encephalopathy, unspecified: Secondary | ICD-10-CM

## 2020-09-09 LAB — GLUCOSE, CAPILLARY
Glucose-Capillary: 148 mg/dL — ABNORMAL HIGH (ref 70–99)
Glucose-Capillary: 123 mg/dL — ABNORMAL HIGH (ref 70–99)
Glucose-Capillary: 132 mg/dL — ABNORMAL HIGH (ref 70–99)
Glucose-Capillary: 150 mg/dL — ABNORMAL HIGH (ref 70–99)
Glucose-Capillary: 191 mg/dL — ABNORMAL HIGH (ref 70–99)

## 2020-09-09 MED ORDER — ENOXAPARIN SODIUM 100 MG/ML IJ SOSY
100.0000 mg | PREFILLED_SYRINGE | INTRAMUSCULAR | Status: DC
  Administered 2020-09-10 – 2020-09-15 (×6): 100 mg via SUBCUTANEOUS
  Filled 2020-09-09 (×6): qty 1

## 2020-09-09 NOTE — Progress Notes (Signed)
Pt refused BIPAP for the night 

## 2020-09-09 NOTE — Progress Notes (Signed)
PROGRESS NOTE  [name] BTD:[id number] DOB: [birth year] DOA: 09/01/2020 PCP: Pcp, No   LOS: 8 days   Brief Narrative / Interim history: Rebecca Kemp is a 65 y.o. female with medical history significant of hypertension, asthma, type 2 diabetes, GERD presented to [facility] ED via EMS for evaluation of right leg injury and inability to ambulate after a mechanical fall at a grocery store.  She was found to have a right femur fracture and is status post ORIF on 6/30  Subjective / 24h Interval events: Doing well this morning, no complaints.  No chest pain, no shortness of breath.  Assessment & Plan: Principal Problem Right femur fracture following fall-orthopedic surgery consulted and followed patient while hospitalized.  She is status post ORIF on 09/03/2020.  Physical therapy recommended SNF however given that there is a co-pay patient does not want to go there.  Recommendations have been changed to CIR and awaiting decision.  Patient inclining to go home however realizes she is touchdown weightbearing and is going to be difficult  Active Problems Acute metabolic encephalopathy, acute hypercarbic respiratory failure-happened on admission, likely in the setting of narcotic use, hypercarbia given elevated PCO2.  Resolved, she is back to baseline today.  She has a history of OSA, has been on CPAP about 10 years ago but currently not using it.  She will need a sleep study as an outpatient CKD 3b- currently at baseline Anemia of chronic disease, acute blood loss anemia-hemoglobin stable. Type 2 diabetes mellitus, diabetic neuropathy-A1c 6.8, continue insulin, gabapentin Obesity, class III-BMI 60, she would benefit from weight loss  Scheduled Meds:  amLODipine  5 mg Oral Daily   cholecalciferol  2,000 Units Oral Daily   docusate sodium  100 mg Oral BID   enoxaparin (LOVENOX) injection  40 mg Subcutaneous Q24H   gabapentin  400 mg Oral QHS   insulin aspart  0-9 Units Subcutaneous Q4H    irbesartan  300 mg Oral Daily   lactulose  20 g Oral BID   tiZANidine  4 mg Oral QHS   vitamin B-12  500 mcg Oral Daily   Vitamin D (Ergocalciferol)  50,000 Units Oral Q7 days   Continuous Infusions: PRN Meds:.acetaminophen **OR** acetaminophen, albuterol, diphenhydrAMINE, metoCLOPramide **OR** metoCLOPramide (REGLAN) injection, ondansetron **OR** ondansetron (ZOFRAN) IV, oxyCODONE-acetaminophen, polyethylene glycol  Diet Orders (From admission, onward)     Start     Ordered   09/05/20 0757  Diet Heart Room service appropriate? No; Fluid consistency: Thin  Diet effective now       Question Answer Comment  Room service appropriate? No   Fluid consistency: Thin      09/05/20 0756            DVT prophylaxis: enoxaparin (LOVENOX) injection 40 mg Start: 09/04/20 0800 SCDs Start: 09/03/20 1856 SCDs Start: 09/02/20 0305    Code Status: Full Code  Family Communication: No family at bedside  Status is: Inpatient  Remains inpatient appropriate because:Inpatient level of care appropriate due to severity of illness  Dispo: The patient is from: Home              Anticipated d/c is to: SNF              Patient currently is not medically stable to d/c.   Difficult to place patient No  Level of care: Med-Surg  Consultants:  Orthopedic surgery  Procedures:  ORIF femur fracture  Microbiology  None   Antimicrobials: None     Objective: Vitals:  09/08/20 0727 09/08/20 1603 09/08/20 2115 09/09/20 0759  BP: (!) 109/48 (!) 142/57 (!) 107/55 (!) 149/71  Pulse: 83 87 86 97  Resp: 16 18 18 17   Temp: 98.6 F (37 C) 98.3 F (36.8 C) 98.9 F (37.2 C) 98.6 F (37 C)  TempSrc: Oral Oral Oral Oral  SpO2: 98% 97% 97% 92%  Weight:      Height:       No intake or output data in the 24 hours ending 09/09/20 1153 Filed Weights   09/02/20 0025  Weight: (!) 196 kg    Examination:  Constitutional: NAD Eyes: no scleral icterus ENMT: Mucous membranes are moist.  Neck:  normal, supple Respiratory: clear to auscultation bilaterally, no wheezing, no crackles. Normal respiratory effort. No accessory muscle use.  Cardiovascular: Regular rate and rhythm, no murmurs / rubs / gallops. No LE edema. Good peripheral pulses Abdomen: non distended, no tenderness. Bowel sounds positive.  Musculoskeletal: no clubbing / cyanosis.  Skin: no rashes Neurologic: non focal    Data Reviewed: I have independently reviewed following labs and imaging studies   CBC: Recent Labs  Lab 09/03/20 0054 09/03/20 2027 09/04/20 0156 09/05/20 0101 09/08/20 0339  WBC 8.9 10.4 8.7 7.2 5.9  NEUTROABS 6.2  --   --   --  3.2  HGB 10.2* 9.9* 9.1* 9.4* 9.4*  HCT 32.4* 31.1* 28.4* 29.8* 29.3*  MCV 97.0 95.7 96.3 96.1 94.5  PLT 209 196 201 191 294   Basic Metabolic Panel: Recent Labs  Lab 09/03/20 0054 09/03/20 1927 09/04/20 0156 09/05/20 0101 09/06/20 0134 09/08/20 0339  NA 140  --  143 142 140 142  K 4.5  --  4.4 4.4 4.0 3.8  CL 103  --  106 105 104 98  CO2 31  --  33* 31 27 35*  GLUCOSE 147*  --  145* 152* 135* 153*  BUN 38*  --  33* 30* 30* 25*  CREATININE 1.84* 1.43* 1.32* 1.13* 1.29* 1.17*  CALCIUM 9.3  --  9.0 9.2 9.1 9.8   Liver Function Tests: Recent Labs  Lab 09/03/20 0054 09/08/20 0339  AST 13* 21  ALT 13 12  ALKPHOS 74 71  BILITOT 0.8 1.0  PROT 5.7* 6.2*  ALBUMIN 2.5* 2.3*   Coagulation Profile: Recent Labs  Lab 09/03/20 0054  INR 1.2   HbA1C: No results for input(s): HGBA1C in the last 72 hours. CBG: Recent Labs  Lab 09/08/20 1645 09/08/20 2016 09/09/20 0120 09/09/20 0531 09/09/20 1111  GLUCAP 148* 173* 148* 123* 132*    Recent Results (from the past 240 hour(s))  SARS CORONAVIRUS 2 (TAT 6-24 HRS) Nasopharyngeal Nasopharyngeal Swab     Status: None   Collection Time: 09/02/20  3:20 AM   Specimen: Nasopharyngeal Swab  Result Value Ref Range Status   SARS Coronavirus 2 NEGATIVE NEGATIVE Final    Comment: (NOTE) SARS-CoV-2 target  nucleic acids are NOT DETECTED.  The SARS-CoV-2 RNA is generally detectable in upper and lower respiratory specimens during the acute phase of infection. Negative results do not preclude SARS-CoV-2 infection, do not rule out co-infections with other pathogens, and should not be used as the sole basis for treatment or other patient management decisions. Negative results must be combined with clinical observations, patient history, and epidemiological information. The expected result is Negative.  Fact Sheet for Patients: 09/04/20  Fact Sheet for Healthcare Providers: HairSlick.no  This test is not yet approved or cleared by the quierodirigir.com FDA and  has  been authorized for detection and/or diagnosis of SARS-CoV-2 by FDA under an Emergency Use Authorization (EUA). This EUA will remain  in effect (meaning this test can be used) for the duration of the COVID-19 declaration under Se ction 564(b)(1) of the Act, 21 U.S.C. section 360bbb-3(b)(1), unless the authorization is terminated or revoked sooner.  Performed at [facility], [address], [place], [place] [address]   Surgical PCR screen     Status: None   Collection Time: 09/02/20  4:40 PM   Specimen: Nasal Mucosa; Nasal Swab  Result Value Ref Range Status   MRSA, PCR NEGATIVE NEGATIVE Final   Staphylococcus aureus NEGATIVE NEGATIVE Final    Comment: (NOTE) The Xpert SA Assay (FDA approved for NASAL specimens in patients 47 years of age and older), is one component of a comprehensive surveillance program. It is not intended to diagnose infection nor to guide or monitor treatment. Performed at [facility], [address] [place], [place] [address]      Radiology Studies: No results found.  Pamella Pert, MD, PhD [facility]  Between 7 am - 7 pm I am available, please contact me via Amion (for emergencies) or Securechat (non urgent  messages)  Between 7 pm - 7 am I am not available, please contact night coverage MD/APP via Amion

## 2020-09-09 NOTE — Progress Notes (Signed)
Received patient awake,alert/orientedx4 and able to verbalize needs. NAD noted;respirations even on room air. Dressing to RLE c/d/I; sensation intact and able to wiggle toes. Movement/sensation to all other extremities present. Whiteboard updated. All safety measures in place and personal belongings within reach.

## 2020-09-09 NOTE — Progress Notes (Signed)
Physical Therapy Treatment Patient Details Name: Rebecca Kemp MRN: [id number] DOB: 09/29/1955 Today's Date: 09/09/2020    History of Present Illness Pt is a 65 y/o female admitted 6/28 after suffering a mechanical fall at the grocery store.  Pt s/p ORIF of right intra-articular distal femur fracture.  Pt initially with encephalopathy that is improving.  PMHx:  HTN, DM2.    PT Comments    Dtr Rebecca Kemp present for treatment as pt desires to return home as she can not afford the co-pays. Attempted std pvt to w/c and lateral scoot with and without slide board into w/c without success. Instructed family that the safest form of transfer in/out of bed is the hoyer lift, in which all parties agreed. Strong encouraged pt to consider CIR or even ST-SNF as family may or may not be able to bump her up/down stairs in w/c to enter/exit home and are unsure if the wide w/c will even fit through the door. Due to patients obesity there is a high risk of injury to both pt and family members trying to assist, as pt unable to WB on R LE. Pt family is very supportive however after both family and patient realize how difficulty and unsafe going home would be. I do not recommend d/c home unless the family has a hoyer lift to assist pt with transfers. Continue to recommend CIR Upon d/c to allow for pt to achieve a level of assist that her family can care for. Acute PT to cont to follow.   Follow Up Recommendations  CIR     Equipment Recommendations  Hospital bed;Other (comment) (Bariatric w/c with drop arm; 3 in 1 bariatric with drop arm; hospital bed; hoyer lift; bariatric RW)    Recommendations for Other Services       Precautions / Restrictions Precautions Precautions: Fall Restrictions Weight Bearing Restrictions: Yes RLE Weight Bearing: Touchdown weight bearing    Mobility  Bed Mobility Overal bed mobility: Needs Assistance Bed Mobility: Supine to Sit     Supine to sit: Min assist     General bed  mobility comments: pt brought self to long sit with HOB elevated, minA for R LE management of EOB    Transfers Overall transfer level: Needs assistance Equipment used: Rolling walker (2 wheeled);Sliding board Transfers: Sit to/from Stand;Lateral/Scoot Transfers Sit to Stand:  (attempted x2, uanble to achieve)        Lateral/Scoot Transfers: Mod assist;Max assist;+2 physical assistance;With slide board General transfer comment: brought in wide w/c to practice to mimic home set up however pt unable to complete lateral slide transfer due to inability to get over the wheel of w/c. Pt then used slide board to slide into drop arm recliner with modAx2. Discussed at length with family and pt that hoyer lift would be the safest form of transfer at home untl pt gained more strength.  Ambulation/Gait             General Gait Details: pt unable   Stairs             Wheelchair Mobility    Modified Rankin (Stroke Patients Only)       Balance Overall balance assessment: Needs assistance Sitting-balance support: Feet supported;No upper extremity supported Sitting balance-Leahy Scale: Good         Standing balance comment: unable                            Cognition Arousal/Alertness: Awake/alert  Behavior During Therapy: WFL for tasks assessed/performed Overall Cognitive Status: Within Functional Limits for tasks assessed                                 General Comments: pt realizing how difficult it will be at home      Exercises General Exercises - Lower Extremity Ankle Circles/Pumps: AROM;Both;10 reps;Seated Quad Sets: AROM;Left;10 reps;Supine Long Arc Quad: AAROM;Right;10 reps;Seated Heel Slides: AAROM;Right;10 reps;Supine Hip ABduction/ADduction: AAROM;10 reps;Right;Supine    General Comments General comments (skin integrity, edema, etc.): R LE with bruising, dressings on R LE over incision intact without drainage      Pertinent  Vitals/Pain Pain Assessment: Faces Faces Pain Scale: Hurts even more Pain Location: R LE with transfers Pain Descriptors / Indicators: Grimacing;Guarding Pain Intervention(s): Monitored during session    Home Living                      Prior Function            PT Goals (current goals can now be found in the care plan section) Progress towards PT goals: Progressing toward goals    Frequency    Min 4X/week      PT Plan Current plan remains appropriate    Co-evaluation              AM-PAC PT "6 Clicks" Mobility   Outcome Measure  Help needed turning from your back to your side while in a flat bed without using bedrails?: A Little Help needed moving from lying on your back to sitting on the side of a flat bed without using bedrails?: A Little Help needed moving to and from a bed to a chair (including a wheelchair)?: Total Help needed standing up from a chair using your arms (e.g., wheelchair or bedside chair)?: Total Help needed to walk in hospital room?: Total Help needed climbing 3-5 steps with a railing? : Total 6 Click Score: 10    End of Session Equipment Utilized During Treatment: Gait belt Activity Tolerance: Patient tolerated treatment well Patient left: in chair;with chair alarm set;with call bell/phone within reach Nurse Communication: Mobility status;Need for lift equipment PT Visit Diagnosis: Other abnormalities of gait and mobility (R26.89);Muscle weakness (generalized) (M62.81);Pain Pain - Right/Left: Right Pain - part of body: Leg     Time: 1108-1200 PT Time Calculation (min) (ACUTE ONLY): 52 min  Charges:  $Therapeutic Exercise: 8-22 mins $Therapeutic Activity: 23-37 mins                     Lewis Shock, PT, DPT Acute Rehabilitation Services Pager #: [phone] Office #: [phone]    Iona Hansen 09/09/2020, 3:11 PM

## 2020-09-09 NOTE — Progress Notes (Signed)
Inpatient Rehab Admissions Coordinator:   I met with Pt and family to discuss potential CIR admit.  They are interested but want to know her copays first. I will check her benefits and get back to her.   Clemens Catholic, [place], [place] Admissions Coordinator  [phone] ([place]) [phone] (office)

## 2020-09-09 NOTE — Plan of Care (Signed)
  Problem: Health Behavior/Discharge Planning: Goal: Ability to manage health-related needs will improve Outcome: Progressing   Problem: Clinical Measurements: Goal: Ability to maintain clinical measurements within normal limits will improve Outcome: Progressing Goal: Will remain free from infection Outcome: Progressing Goal: Diagnostic test results will improve Outcome: Progressing   

## 2020-09-09 NOTE — Progress Notes (Signed)
Inpatient Rehab Admissions Coordinator:     I checked Pt.'s benefits for various rehab services. She states that she now wants to go home with [facility] and does not wish to pursue CIR due to cost. I do think that Pt.'s insurance would likely deny CIR even if she was agreeable to come. CIR will sign off.  Megan Salon, MS, CCC-SLP Rehab Admissions Coordinator  [phone] (celll) [phone] (office)

## 2020-09-10 LAB — GLUCOSE, CAPILLARY
Glucose-Capillary: 146 mg/dL — ABNORMAL HIGH (ref 70–99)
Glucose-Capillary: 130 mg/dL — ABNORMAL HIGH (ref 70–99)
Glucose-Capillary: 148 mg/dL — ABNORMAL HIGH (ref 70–99)
Glucose-Capillary: 171 mg/dL — ABNORMAL HIGH (ref 70–99)
Glucose-Capillary: 185 mg/dL — ABNORMAL HIGH (ref 70–99)
Glucose-Capillary: 212 mg/dL — ABNORMAL HIGH (ref 70–99)

## 2020-09-10 LAB — CREATININE, SERUM
Creatinine, Ser: 1.26 mg/dL — ABNORMAL HIGH (ref 0.44–1.00)
GFR, Estimated: 48 mL/min — ABNORMAL LOW (ref >60)

## 2020-09-10 NOTE — TOC Progression Note (Addendum)
Transition of Care [facility]) - Progression Note    Patient Details  Name: Rebecca Kemp MRN: [id number] Date of Birth: Jun 19, 1955  [facility] CM/SW Contact  Leone Haven, RN Phone Number: 09/10/2020, 2:35 PM  Clinical Narrative:    I spoke with patient, she states that her granddaughter is bringing some DME out to the house today to see which will work for them, because they are having issues with the copay cost. so they want to see what is working on the DME that she is bringing so they will know what they want [employer] to bring.  Will need to check with patient tomorrow to see what DME she would like [employer] to deliver to the house. We are thinking it may be ( hoyer lift, w/chair, rolling walker). But not quite sure.  She is also 196 kg, need heavy duty.  [employer] already has the information for the DME they are just waiting to hear what is needed. This NCM tried to contact daughter also did not get an answer.  Patient is going to daughter's address.   NCM offered choice  for HHPT, HHPT. She does not have a preference. Looks like she is already set up with [facility] thru [employer] per [employer] note.   Expected Discharge Plan: Home w Home Health Services Barriers to Discharge: Continued Medical Work up  Expected Discharge Plan and Services Expected Discharge Plan: Home w Home Health Services   Discharge Planning Services: CM Consult Post Acute Care Choice: Home Health, Durable Medical Equipment Living arrangements for the past 2 months: Mobile Home                 DME Arranged: 3-N-1, Walker rolling, Hospital bed, Wheelchair manual DME Agency: [employer] Date DME Agency Contacted: 09/08/20 Time DME Agency Contacted: [phone] Representative spoke with at DME Agency: Velna Hatchet HH Arranged: PT, OT, Nurse's Aide HH Agency: [facility] Date [facility] Agency Contacted: 09/08/20 Time HH Agency Contacted: [phone] Representative spoke with at [facility] Agency: via Patton Salles.   Social  Determinants of Health (SDOH) Interventions    Readmission Risk Interventions No flowsheet data found.

## 2020-09-10 NOTE — Progress Notes (Signed)
PROGRESS NOTE  Rebecca Kemp VZD:[id number] DOB: 13-Apr-1955 DOA: 09/01/2020 PCP: Pcp, No   LOS: 9 days   Brief Narrative / Interim history: Rebecca Kemp is a 65 y.o. female with medical history significant of hypertension, asthma, type 2 diabetes, GERD presented to [facility] ED via EMS for evaluation of right leg injury and inability to ambulate after a mechanical fall at a grocery store.  She was found to have a right femur fracture and is status post ORIF on 6/30  Subjective / 24h Interval events: No complaints, doing well   Assessment & Plan: Principal Problem Right femur fracture following fall-orthopedic surgery consulted and followed patient while hospitalized.  She is status post ORIF on 09/03/2020.  Physical therapy recommended SNF however given that there is a co-pay patient does not want to go there.  Recommendations have been changed to CIR however patient refusing and wants to go home. Awaiting full setup ready prior to dc  Active Problems Acute metabolic encephalopathy, acute hypercarbic respiratory failure-happened on admission, likely in the setting of narcotic use, hypercarbia given elevated PCO2.  Resolved, she is back to baseline today.  She has a history of OSA, has been on CPAP about 10 years ago but currently not using it.  She will need a sleep study as an outpatient CKD 3b- currently at baseline Anemia of chronic disease, acute blood loss anemia-hemoglobin stable. Type 2 diabetes mellitus, diabetic neuropathy-A1c 6.8, continue insulin, gabapentin Obesity, class III-BMI 60, she would benefit from weight loss  Scheduled Meds:  amLODipine  5 mg Oral Daily   cholecalciferol  2,000 Units Oral Daily   docusate sodium  100 mg Oral BID   enoxaparin (LOVENOX) injection  100 mg Subcutaneous Q24H   gabapentin  400 mg Oral QHS   insulin aspart  0-9 Units Subcutaneous Q4H   irbesartan  300 mg Oral Daily   lactulose  20 g Oral BID   tiZANidine  4 mg Oral QHS   vitamin B-12   500 mcg Oral Daily   Vitamin D (Ergocalciferol)  50,000 Units Oral Q7 days   Continuous Infusions: PRN Meds:.acetaminophen **OR** acetaminophen, albuterol, diphenhydrAMINE, metoCLOPramide **OR** metoCLOPramide (REGLAN) injection, ondansetron **OR** ondansetron (ZOFRAN) IV, oxyCODONE-acetaminophen, polyethylene glycol  Diet Orders (From admission, onward)     Start     Ordered   09/05/20 0757  Diet Heart Room service appropriate? No; Fluid consistency: Thin  Diet effective now       Question Answer Comment  Room service appropriate? No   Fluid consistency: Thin      09/05/20 0756            DVT prophylaxis: SCDs Start: 09/03/20 1856 SCDs Start: 09/02/20 0305    Code Status: Full Code  Family Communication: No family at bedside  Status is: Inpatient  Remains inpatient appropriate because:Inpatient level of care appropriate due to severity of illness  Dispo: The patient is from: Home              Anticipated d/c is to: SNF              Patient currently is not medically stable to d/c.   Difficult to place patient No  Level of care: Med-Surg  Consultants:  Orthopedic surgery  Procedures:  ORIF femur fracture  Microbiology  None   Antimicrobials: None     Objective: Vitals:   09/09/20 0759 09/09/20 1526 09/09/20 2142 09/10/20 0738  BP: (!) 149/71 138/74 (!) 148/81 127/69  Pulse: 97 94 90  84  Resp: 17 17 18    Temp: 98.6 F (37 C) 98 F (36.7 C) 98.8 F (37.1 C) 98.2 F (36.8 C)  TempSrc: Oral Oral Oral Oral  SpO2: 92% 93% 94% 96%  Weight:      Height:        Intake/Output Summary (Last 24 hours) at 09/10/2020 1444 Last data filed at 09/10/2020 0730 Gross per 24 hour  Intake --  Output 800 ml  Net -800 ml   Filed Weights   09/02/20 0025  Weight: (!) 196 kg    Examination:  Constitutional: nad Respiratory: CTA biL Cardiovascular: RRR, no mrg  Data Reviewed: I have independently reviewed following labs and imaging studies   CBC: Recent  Labs  Lab 09/10/2020 2027 09/04/20 0156 09/05/20 0101 09/08/20 0339  WBC 10.4 8.7 7.2 5.9  NEUTROABS  --   --   --  3.2  HGB 9.9* 9.1* 9.4* 9.4*  HCT 31.1* 28.4* 29.8* 29.3*  MCV 95.7 96.3 96.1 94.5  PLT 196 201 191 294    Basic Metabolic Panel: Recent Labs  Lab 09/04/20 0156 09/05/20 0101 09/06/20 0134 09/08/20 0339 09/10/20 0611  NA 143 142 140 142  --   K 4.4 4.4 4.0 3.8  --   CL 106 105 104 98  --   CO2 33* 31 27 35*  --   GLUCOSE 145* 152* 135* 153*  --   BUN 33* 30* 30* 25*  --   CREATININE 1.32* 1.13* 1.29* 1.17* 1.26*  CALCIUM 9.0 9.2 9.1 9.8  --     Liver Function Tests: Recent Labs  Lab 09/08/20 0339  AST 21  ALT 12  ALKPHOS 71  BILITOT 1.0  PROT 6.2*  ALBUMIN 2.3*    Coagulation Profile: No results for input(s): INR, PROTIME in the last 168 hours.  HbA1C: No results for input(s): HGBA1C in the last 72 hours. CBG: Recent Labs  Lab 09/09/20 2137 09/10/20 0119 09/10/20 0517 09/10/20 0902 09/10/20 1140  GLUCAP 191* 146* 130* 148* 171*     Recent Results (from the past 240 hour(s))  SARS CORONAVIRUS 2 (TAT 6-24 HRS) Nasopharyngeal Nasopharyngeal Swab     Status: None   Collection Time: 09/02/20  3:20 AM   Specimen: Nasopharyngeal Swab  Result Value Ref Range Status   SARS Coronavirus 2 NEGATIVE NEGATIVE Final    Comment: (NOTE) SARS-CoV-2 target nucleic acids are NOT DETECTED.  The SARS-CoV-2 RNA is generally detectable in upper and lower respiratory specimens during the acute phase of infection. Negative results do not preclude SARS-CoV-2 infection, do not rule out co-infections with other pathogens, and should not be used as the sole basis for treatment or other patient management decisions. Negative results must be combined with clinical observations, patient history, and epidemiological information. The expected result is Negative.  Fact Sheet for Patients: 09/04/20  Fact Sheet for Healthcare  Providers: HairSlick.no  This test is not yet approved or cleared by the [place]irigir.com FDA and  has been authorized for detection and/or diagnosis of SARS-CoV-2 by FDA under an Emergency Use Authorization (EUA). This EUA will remain  in effect (meaning this test can be used) for the duration of the COVID-19 declaration under Se ction 564(b)(1) of the Act, 21 U.S.C. section 360bbb-3(b)(1), unless the authorization is terminated or revoked sooner.  Performed at [facility], [address] [place], [place]   Surgical PCR screen     Status: None   Collection Time: 09/02/20  4:40  PM   Specimen: Nasal Mucosa; Nasal Swab  Result Value Ref Range Status   MRSA, PCR NEGATIVE NEGATIVE Final   Staphylococcus aureus NEGATIVE NEGATIVE Final    Comment: (NOTE) The Xpert SA Assay (FDA approved for NASAL specimens in patients 69 years of age and older), is one component of a comprehensive surveillance program. It is not intended to diagnose infection nor to guide or monitor treatment. Performed at [facility], [address] [place], [place] [address]       Radiology Studies: No results found.  Pamella Pert, MD, PhD [facility]  Between 7 am - 7 pm I am available, please contact me via Amion (for emergencies) or Securechat (non urgent messages)  Between 7 pm - 7 am I am not available, please contact night coverage MD/APP via Amion

## 2020-09-10 NOTE — Plan of Care (Signed)
  Problem: Activity: Goal: Risk for activity intolerance will decrease Outcome: Progressing   Problem: Pain Managment: Goal: General experience of comfort will improve Outcome: Progressing   Problem: Skin Integrity: Goal: Risk for impaired skin integrity will decrease Outcome: Progressing   

## 2020-09-10 NOTE — Progress Notes (Signed)
Physical Therapy Treatment Patient Details Name: Rebecca Kemp MRN: [id number] DOB: 1955-07-04 Today's Date: 09/10/2020    History of Present Illness Pt is a 65 y/o female admitted 6/28 after suffering a mechanical fall at the grocery store.  Pt s/p ORIF of right intra-articular distal femur fracture.  Pt initially with encephalopathy that is improving.  PMHx:  HTN, DM2.    PT Comments    Pt supine in bed on arrival.  Pt is poor spirits and tearful at time.  Pt's demeanor improved as tx progressed and she was able to perform all aspects of mobility with decreased assistance.  She continues to require +2 assistance.  CIR remains appropriate for aggressive rehab before returning home.  Pt remains very motivated but having difficulty "processing her situation."  Offered chaplain services but she declined.     Follow Up Recommendations  CIR     Equipment Recommendations  Hospital bed;Other (comment) (Bariatric w/c with drop arm; 3 in 1 bariatric with drop arm; hospital bed; hoyer lift; bariatric RW)    Recommendations for Other Services       Precautions / Restrictions Precautions Precautions: Fall Restrictions Weight Bearing Restrictions: Yes RLE Weight Bearing: Touchdown weight bearing    Mobility  Bed Mobility Overal bed mobility: Needs Assistance Bed Mobility: Supine to Sit     Supine to sit: Min assist     General bed mobility comments: Tilted bed to improve ease into long sitting.  Pt then moved to edge of bed with support for RLE.  Pt able to maintain her balance once in sitting.    Transfers Overall transfer level: Needs assistance Equipment used: Sliding board Transfers: Lateral/Scoot Transfers          Lateral/Scoot Transfers: From elevated surface;With slide board;Min assist;+2 physical assistance General transfer comment: Pt required assistance to move from bed to drop arm recliner.  Cues to weight shift forward and maintain weight bearing.  PTA did have to  place and removed transfer board.  Pt performed with improved ease this session.  Ambulation/Gait Ambulation/Gait assistance:  (unable.)               Stairs             Wheelchair Mobility    Modified Rankin (Stroke Patients Only)       Balance Overall balance assessment: Needs assistance Sitting-balance support: Feet supported;No upper extremity supported Sitting balance-Leahy Scale: Good     Standing balance support: Bilateral upper extremity supported Standing balance-Leahy Scale: Poor Standing balance comment: unable                            Cognition Arousal/Alertness: Awake/alert Behavior During Therapy: WFL for tasks assessed/performed Overall Cognitive Status: Within Functional Limits for tasks assessed                                        Exercises General Exercises - Lower Extremity Long Arc Quad: AROM;Both;10 reps;Seated    General Comments        Pertinent Vitals/Pain Pain Assessment: 0-10 Pain Score: 6  Pain Location: R LE with transfers Pain Descriptors / Indicators: Grimacing;Guarding Pain Intervention(s): Monitored during session;Repositioned    Home Living                      Prior Function  PT Goals (current goals can now be found in the care plan section) Acute Rehab PT Goals Patient Stated Goal: to go home Potential to Achieve Goals: Good Progress towards PT goals: Progressing toward goals    Frequency    Min 4X/week      PT Plan Current plan remains appropriate    Co-evaluation              AM-PAC PT "6 Clicks" Mobility   Outcome Measure  Help needed turning from your back to your side while in a flat bed without using bedrails?: A Little Help needed moving from lying on your back to sitting on the side of a flat bed without using bedrails?: A Little Help needed moving to and from a bed to a chair (including a wheelchair)?: A Lot Help needed standing  up from a chair using your arms (e.g., wheelchair or bedside chair)?: Total Help needed to walk in hospital room?: Total Help needed climbing 3-5 steps with a railing? : Total 6 Click Score: 11    End of Session Equipment Utilized During Treatment: Gait belt Activity Tolerance: Patient tolerated treatment well Patient left: in chair;with chair alarm set;with call bell/phone within reach Nurse Communication: Mobility status;Need for lift equipment (maximove sling under patients bottom.) PT Visit Diagnosis: Other abnormalities of gait and mobility (R26.89);Muscle weakness (generalized) (M62.81);Pain Pain - Right/Left: Right Pain - part of body: Leg     Time: 7711-6579 PT Time Calculation (min) (ACUTE ONLY): 20 min  Charges:  $Therapeutic Activity: 8-22 mins                     Bonney Leitz , PTA Acute Rehabilitation Services Pager [phone] Office [phone]    Rebecca Kemp Artis Delay 09/10/2020, 5:31 PM

## 2020-09-11 LAB — GLUCOSE, CAPILLARY
Glucose-Capillary: 146 mg/dL — ABNORMAL HIGH (ref 70–99)
Glucose-Capillary: 154 mg/dL — ABNORMAL HIGH (ref 70–99)
Glucose-Capillary: 160 mg/dL — ABNORMAL HIGH (ref 70–99)
Glucose-Capillary: 160 mg/dL — ABNORMAL HIGH (ref 70–99)
Glucose-Capillary: 166 mg/dL — ABNORMAL HIGH (ref 70–99)
Glucose-Capillary: 218 mg/dL — ABNORMAL HIGH (ref 70–99)

## 2020-09-11 NOTE — Plan of Care (Signed)
  Problem: Health Behavior/Discharge Planning: Goal: Ability to manage health-related needs will improve Outcome: Progressing   Problem: Clinical Measurements: Goal: Ability to maintain clinical measurements within normal limits will improve Outcome: Progressing Goal: Will remain free from infection Outcome: Progressing   

## 2020-09-11 NOTE — Progress Notes (Signed)
Physical Therapy Treatment Patient Details Name: Rebecca Kemp MRN: [id number] DOB: 03/24/1955 Today's Date: 09/11/2020    History of Present Illness Pt is a 65 y/o female admitted 6/28 after suffering a mechanical fall at the grocery store.  Pt s/p ORIF of right intra-articular distal femur fracture.  Pt initially with encephalopathy that is improving.  PMHx:  HTN, DM2.    PT Comments    Performed WC mobility this session.  Pt continues to require min assistance overall.  Will need HHPT if CIR is not an option.  Pt will also need EMS transport for entry into home.  CIR remains the best option for recovery.     Follow Up Recommendations  CIR (if unable to go will need HHPT)     Equipment Recommendations  Hospital bed;Other (comment) ((Bariatric w/c with drop arm; 3 in 1 bariatric with drop arm; hospital bed; hoyer lift; bariatric RW))    Recommendations for Other Services       Precautions / Restrictions Precautions Precautions: Fall Restrictions Weight Bearing Restrictions: Yes RLE Weight Bearing: Touchdown weight bearing    Mobility  Bed Mobility Overal bed mobility: Needs Assistance Bed Mobility: Supine to Sit;Sit to Supine     Supine to sit: Min assist Sit to supine: Min assist   General bed mobility comments: Pt needs Min A for BLE management    Transfers Overall transfer level: Needs assistance Equipment used: Sliding board            Lateral/Scoot Transfers: With slide board;Min assist;+2 safety/equipment General transfer comment: Performed scoot to WC and from WC back to bed.  Pt continues to required total assistance for placement of slide board.  Ambulation/Gait                 Psychologist, counselling mobility: Yes Wheelchair propulsion: Both upper extremities;Left lower extremity Wheelchair parts: Needs assistance Distance: 120 ft Min assistance. Wheelchair Assistance Details (indicate  cue type and reason): Cues for locking brakes, propulsion, turning, backing and use of leg rest.  Modified Rankin (Stroke Patients Only)       Balance Overall balance assessment: Needs assistance Sitting-balance support: Feet supported;No upper extremity supported Sitting balance-Leahy Scale: Good       Standing balance-Leahy Scale: Zero                              Cognition Arousal/Alertness: Awake/alert Behavior During Therapy: WFL for tasks assessed/performed Overall Cognitive Status: Within Functional Limits for tasks assessed                                        Exercises      General Comments        Pertinent Vitals/Pain Pain Assessment: 0-10 Faces Pain Scale: Hurts even more Pain Location: R LE with transfers Pain Descriptors / Indicators: Grimacing;Guarding Pain Intervention(s): Monitored during session;Repositioned    Home Living                      Prior Function            PT Goals (current goals can now be found in the care plan section) Acute Rehab PT Goals Patient Stated Goal: to go home Potential to Achieve Goals: Good Additional  Goals Additional Goal #1: Pt will mobilize in w/c for 150' with supervision. Will utilize brakes for safe transfers without cues. Progress towards PT goals: Progressing toward goals    Frequency    Min 4X/week      PT Plan Current plan remains appropriate    Co-evaluation              AM-PAC PT "6 Clicks" Mobility   Outcome Measure  Help needed turning from your back to your side while in a flat bed without using bedrails?: A Little Help needed moving from lying on your back to sitting on the side of a flat bed without using bedrails?: A Little Help needed moving to and from a bed to a chair (including a wheelchair)?: A Lot Help needed standing up from a chair using your arms (e.g., wheelchair or bedside chair)?: Total Help needed to walk in hospital room?:  Total Help needed climbing 3-5 steps with a railing? : Total 6 Click Score: 11    End of Session Equipment Utilized During Treatment: Gait belt Activity Tolerance: Patient tolerated treatment well Patient left: with call bell/phone within reach;in bed;with bed alarm set Nurse Communication: Mobility status;Patient requests pain meds PT Visit Diagnosis: Other abnormalities of gait and mobility (R26.89);Muscle weakness (generalized) (M62.81);Pain Pain - Right/Left: Right Pain - part of body: Leg     Time: 1531-1605 PT Time Calculation (min) (ACUTE ONLY): 34 min  Charges:  $Therapeutic Activity: 8-22 mins $Wheel Chair Management: 8-22 mins                     Bonney Leitz , PTA Acute Rehabilitation Services Pager [phone] Office [phone]    Rebecca Kemp 09/11/2020, 6:42 PM

## 2020-09-11 NOTE — Progress Notes (Signed)
PROGRESS NOTE  Rebecca Kemp [id number]:[id number] DOB: September 19, 1955 DOA: 09/01/2020 PCP: Pcp, No   LOS: 10 days   Brief Narrative / Interim history: Rebecca Kemp is a 65 y.o. female with medical history significant of hypertension, asthma, type 2 diabetes, GERD presented to [facility] ED via EMS for evaluation of right leg injury and inability to ambulate after a mechanical fall at a grocery store.  She was found to have a right femur fracture and is status post ORIF on 6/30  Subjective / 24h Interval events: Doing well  Assessment & Plan: Principal Problem Right femur fracture following fall-orthopedic surgery consulted and followed patient while hospitalized.  She is status post ORIF on 09/03/2020.  Physical therapy recommended SNF however given that there is a co-pay patient does not want to go there.  Recommendations have been changed to CIR however patient refusing and wants to go home. Awaiting full setup ready prior to dc, items have not yet been delivered  Active Problems Acute metabolic encephalopathy, acute hypercarbic respiratory failure-happened on admission, likely in the setting of narcotic use, hypercarbia given elevated PCO2.  Resolved, she is back to baseline today.  She has a history of OSA, has been on CPAP about 10 years ago but currently not using it.  She will need a sleep study as an outpatient CKD 3b- currently at baseline Anemia of chronic disease, acute blood loss anemia-hemoglobin stable. Type 2 diabetes mellitus, diabetic neuropathy-A1c 6.8, continue insulin, gabapentin Obesity, class III-BMI 60, she would benefit from weight loss  Scheduled Meds:  amLODipine  5 mg Oral Daily   cholecalciferol  2,000 Units Oral Daily   docusate sodium  100 mg Oral BID   enoxaparin (LOVENOX) injection  100 mg Subcutaneous Q24H   gabapentin  400 mg Oral QHS   insulin aspart  0-9 Units Subcutaneous Q4H   irbesartan  300 mg Oral Daily   lactulose  20 g Oral BID   tiZANidine  4 mg Oral  QHS   vitamin B-12  500 mcg Oral Daily   Vitamin D (Ergocalciferol)  50,000 Units Oral Q7 days   Continuous Infusions: PRN Meds:.acetaminophen **OR** acetaminophen, albuterol, diphenhydrAMINE, metoCLOPramide **OR** metoCLOPramide (REGLAN) injection, ondansetron **OR** ondansetron (ZOFRAN) IV, oxyCODONE-acetaminophen, polyethylene glycol  Diet Orders (From admission, onward)     Start     Ordered   09/05/20 0757  Diet Heart Room service appropriate? No; Fluid consistency: Thin  Diet effective now       Question Answer Comment  Room service appropriate? No   Fluid consistency: Thin      09/05/20 0756            DVT prophylaxis: SCDs Start: 09/03/20 1856 SCDs Start: 09/02/20 0305    Code Status: Full Code  Family Communication: No family at bedside  Status is: Inpatient  Remains inpatient appropriate because:Inpatient level of care appropriate due to severity of illness  Dispo: The patient is from: Home              Anticipated d/c is to: SNF              Patient currently is not medically stable to d/c.   Difficult to place patient No  Level of care: Med-Surg  Consultants:  Orthopedic surgery  Procedures:  ORIF femur fracture  Microbiology  None   Antimicrobials: None     Objective: Vitals:   09/10/20 0738 09/10/20 1457 09/10/20 2033 09/11/20 0750  BP: 127/69 (!) 119/56 117/61 (!) 121/45  Pulse:  84 88 80 90  Resp:      Temp: 98.2 F (36.8 C)  98.4 F (36.9 C) 98.4 F (36.9 C)  TempSrc: Oral  Oral Oral  SpO2: 96% 98% 96% 98%  Weight:      Height:        Intake/Output Summary (Last 24 hours) at 09/11/2020 1454 Last data filed at 09/11/2020 0500 Gross per 24 hour  Intake --  Output 450 ml  Net -450 ml    Filed Weights   09/02/20 0025  Weight: (!) 196 kg    Examination:  Constitutional: nad Respiratory: cta Cardiovascular: rrr, no edema  Data Reviewed: I have independently reviewed following labs and imaging studies   CBC: Recent Labs   Lab Sep 10, 2020 0101 09/08/20 0339  WBC 7.2 5.9  NEUTROABS  --  3.2  HGB 9.4* 9.4*  HCT 29.8* 29.3*  MCV 96.1 94.5  PLT 191 294    Basic Metabolic Panel: Recent Labs  Lab 2020-09-10 0101 09/06/20 0134 09/08/20 0339 09/10/20 0611  NA 142 140 142  --   K 4.4 4.0 3.8  --   CL 105 104 98  --   CO2 31 27 35*  --   GLUCOSE 152* 135* 153*  --   BUN 30* 30* 25*  --   CREATININE 1.13* 1.29* 1.17* 1.26*  CALCIUM 9.2 9.1 9.8  --     Liver Function Tests: Recent Labs  Lab 09/08/20 0339  AST 21  ALT 12  ALKPHOS 71  BILITOT 1.0  PROT 6.2*  ALBUMIN 2.3*    Coagulation Profile: No results for input(s): INR, PROTIME in the last 168 hours.  HbA1C: No results for input(s): HGBA1C in the last 72 hours. CBG: Recent Labs  Lab 09/10/20 2216 09/11/20 0044 09/11/20 0434 09/11/20 0815 09/11/20 1110  GLUCAP 212* 160* 146* 160* 154*     Recent Results (from the past 240 hour(s))  SARS CORONAVIRUS 2 (TAT 6-24 HRS) Nasopharyngeal Nasopharyngeal Swab     Status: None   Collection Time: 09/02/20  3:20 AM   Specimen: Nasopharyngeal Swab  Result Value Ref Range Status   SARS Coronavirus 2 NEGATIVE NEGATIVE Final    Comment: (NOTE) SARS-CoV-2 target nucleic acids are NOT DETECTED.  The SARS-CoV-2 RNA is generally detectable in upper and lower respiratory specimens during the acute phase of infection. Negative results do not preclude SARS-CoV-2 infection, do not rule out co-infections with other pathogens, and should not be used as the sole basis for treatment or other patient management decisions. Negative results must be combined with clinical observations, patient history, and epidemiological information. The expected result is Negative.  Fact Sheet for Patients: HairSlick.no  Fact Sheet for Healthcare Providers: quierodirigir.com  This test is not yet approved or cleared by the [place] FDA and  has been  authorized for detection and/or diagnosis of SARS-CoV-2 by FDA under an Emergency Use Authorization (EUA). This EUA will remain  in effect (meaning this test can be used) for the duration of the COVID-19 declaration under Se ction 564(b)(1) of the Act, 21 U.S.C. section 360bbb-3(b)(1), unless the authorization is terminated or revoked sooner.  Performed at [facility], [address], [place], [place] [address]   Surgical PCR screen     Status: None   Collection Time: 09/02/20  4:40 PM   Specimen: Nasal Mucosa; Nasal Swab  Result Value Ref Range Status   MRSA, PCR NEGATIVE NEGATIVE Final   Staphylococcus aureus NEGATIVE NEGATIVE Final  Comment: (NOTE) The Xpert SA Assay (FDA approved for NASAL specimens in patients 24 years of age and older), is one component of a comprehensive surveillance program. It is not intended to diagnose infection nor to guide or monitor treatment. Performed at [facility], [address] [place], [place] [address]       Radiology Studies: No results found.  Pamella Pert, MD, PhD [facility]  Between 7 am - 7 pm I am available, please contact me via Amion (for emergencies) or Securechat (non urgent messages)  Between 7 pm - 7 am I am not available, please contact night coverage MD/APP via Amion

## 2020-09-11 NOTE — TOC Progression Note (Addendum)
Transition of Care (TOC) - Progression Note  Donn Pierini RN, BSN Transitions of Care Unit 4E- RN Case Manager See Treatment Team for direct phone #  Cross coverage for 5N  Patient Details  Name: Rebecca Kemp MRN: [id number] Date of Birth: [birth year]  Transition of Care [facility]) CM/SW Contact  Zenda Alpers, Lenn Sink, RN Phone Number: 09/11/2020, 3:07 PM  Clinical Narrative:    Call made to pt's daughter to f/u on DME needs, per TC with daughter she has spoken with someone from [employer] this morning and made arrangements for needed DME including payment arrangements. DME that was ordered includes hosptial bed w/ hoyer lift, wheelchair, rolling walker, and slide board.  Per daughter [employer] informed her that she would get another call once everything was processed regarding delivery. She has not received that call yet, meaning delivery may happen today or tomorrow.   Per daughter pt will need EMS transport home,  Address confirmed w/ daughter as:  [address] [place] [address] [name] Daughter [phone]   TOC to continue to follow.   55- spoke with [place] w/ [facility]- confirmed referral for [facility] needs has been accepted- HH orders still needed prior to d/c  [facility] orders and D/C summary will need to be faxed to [place] at [place] efax # [phone]      Expected Discharge Plan: Home w Home Health Services Barriers to Discharge: Continued Medical Work up  Expected Discharge Plan and Services Expected Discharge Plan: Home w Home Health Services   Discharge Planning Services: CM Consult Post Acute Care Choice: Home Health, Durable Medical Equipment Living arrangements for the past 2 months: Mobile Home                 DME Arranged: 3-N-1, Walker rolling, Hospital bed, Wheelchair manual DME Agency: [employer] Date DME Agency Contacted: 09/08/20 Time DME Agency Contacted: 1242 Representative spoke with at DME Agency: Velna Hatchet HH Arranged: PT, OT, Nurse's Aide HH  Agency: [facility] Date [facility] Agency Contacted: 09/08/20 Time HH Agency Contacted: 1242 Representative spoke with at [facility] Agency: via Patton Salles.   Social Determinants of Health (SDOH) Interventions    Readmission Risk Interventions No flowsheet data found.

## 2020-09-11 NOTE — Plan of Care (Signed)
  Problem: Education: Goal: Knowledge of General Education information will improve Description: Including pain rating scale, medication(s)/side effects and non-pharmacologic comfort measures Outcome: Progressing   Problem: Activity: Goal: Risk for activity intolerance will decrease Outcome: Progressing   Problem: Pain Managment: Goal: General experience of comfort will improve Outcome: Progressing   

## 2020-09-11 NOTE — Progress Notes (Signed)
Occupational Therapy Treatment Patient Details Name: Rebecca Kemp MRN: [id number] DOB: 02-08-1956 Today's Date: 09/11/2020    History of present illness Pt is a 65 y/o female admitted 6/28 after suffering a mechanical fall at the grocery store.  Pt s/p ORIF of right intra-articular distal femur fracture.  Pt initially with encephalopathy that is improving.  PMHx:  HTN, DM2.   OT comments  Pt making incremental progress with OT goals. This session pt was able to complete grooming and hygiene and UB dressing EOB with no assist. Pt tolerating sitting unsupported for 10+ mins, not requiring a break. When attempting to stand, pt was able to bring her bottom approximately an inch off the bed with max A. Pt then worked on lateral scoots for transfers in and out of her WC once home. At this time she requires min A for LB management with lateral scoots, due to weakness in her LLE. Pt is refusing SNF and CIR at this time, so she will need max HH therapies to assist pt in maximizing her recovery and returning to independence. Acute OT will continue to follow to assist with ADL performance and functional mobility.    Follow Up Recommendations  Home health OT;Supervision/Assistance - 24 hour (Needs MAX HHOT, due to requiring increased assistance.)    Equipment Recommendations  Wheelchair (measurements OT);Wheelchair cushion (measurements OT);Hospital bed    Recommendations for Other Services      Precautions / Restrictions Precautions Precautions: Fall Restrictions Weight Bearing Restrictions: Yes RLE Weight Bearing: Touchdown weight bearing       Mobility Bed Mobility Overal bed mobility: Needs Assistance Bed Mobility: Supine to Sit;Sit to Supine     Supine to sit: Min assist Sit to supine: Min assist   General bed mobility comments: Pt needs Min A for BLE management    Transfers Overall transfer level: Needs assistance Equipment used: 1 person hand held assist Transfers: Lateral/Scoot  Transfers;Sit to/from Stand Sit to Stand: Max assist;From elevated surface        Lateral/Scoot Transfers: Min assist General transfer comment: When attempting to stand, pt was able to bring her bottom approximately 1 inch off the bed. With lateral scoots, pt able to move fairly well, min A required.    Balance Overall balance assessment: Needs assistance Sitting-balance support: Feet supported;No upper extremity supported Sitting balance-Leahy Scale: Good     Standing balance support: Bilateral upper extremity supported Standing balance-Leahy Scale: Zero Standing balance comment: unable                           ADL either performed or assessed with clinical judgement   ADL Overall ADL's : Needs assistance/impaired Eating/Feeding: Independent;Sitting   Grooming: Independent;Sitting           Upper Body Dressing : Independent;Sitting       Toilet Transfer: Moderate assistance;Transfer board           Functional mobility during ADLs: Moderate assistance ((scooting)) General ADL Comments: Pt completed Hygiene/Grooming tasks sitting EOB, as well as eating the rest of her lunch EOB.     Vision       Perception     Praxis      Cognition Arousal/Alertness: Awake/alert Behavior During Therapy: WFL for tasks assessed/performed Overall Cognitive Status: Within Functional Limits for tasks assessed  Exercises General Exercises - Lower Extremity Ankle Circles/Pumps: AROM;Both;5 reps Long Arc Quad: AAROM;Both;5 reps Other Exercises Other Exercises: Chair push up Other Exercises: Bridge pose in bed to get centered   Shoulder Instructions       General Comments VSS on RA, small wound on back, pt reporting very itchy skin, RN notified.    Pertinent Vitals/ Pain       Pain Assessment: 0-10 Pain Score: 4  Pain Location: R LE with transfers Pain Descriptors / Indicators:  Grimacing;Guarding Pain Intervention(s): Limited activity within patient's tolerance;Premedicated before session;Repositioned  Home Living                                          Prior Functioning/Environment              Frequency  Min 2X/week        Progress Toward Goals  OT Goals(current goals can now be found in the care plan section)  Progress towards OT goals: Progressing toward goals  Acute Rehab OT Goals Patient Stated Goal: to go home OT Goal Formulation: With patient Time For Goal Achievement: 09/18/20 Potential to Achieve Goals: Good ADL Goals Pt Will Perform Grooming: with supervision;sitting Pt Will Perform Upper Body Bathing: with min assist;sitting Pt Will Perform Lower Body Bathing: with mod assist;sitting/lateral leans;bed level Additional ADL Goal #1: Pt to demo ability to sit EOB > 5 min with no more than Min A to maintain balance during ADLs Additional ADL Goal #2: Pt to demo bed mobility at Mod A in prep for ADL transfers  Plan Discharge plan remains appropriate    Co-evaluation                 AM-PAC OT "6 Clicks" Daily Activity     Outcome Measure   Help from another person eating meals?: None Help from another person taking care of personal grooming?: A Little Help from another person toileting, which includes using toliet, bedpan, or urinal?: A Lot Help from another person bathing (including washing, rinsing, drying)?: A Lot Help from another person to put on and taking off regular upper body clothing?: A Little Help from another person to put on and taking off regular lower body clothing?: A Lot 6 Click Score: 16    End of Session    OT Visit Diagnosis: Unsteadiness on feet (R26.81);Other abnormalities of gait and mobility (R26.89);Muscle weakness (generalized) (M62.81);History of falling (Z91.81);Other symptoms and signs involving cognitive function;Pain Pain - Right/Left: Right Pain - part of body:  Leg;Knee   Activity Tolerance Patient tolerated treatment well   Patient Left in bed;with call bell/phone within reach   Nurse Communication Mobility status        Time: 4944-9675 OT Time Calculation (min): 39 min  Charges: OT General Charges $OT Visit: 1 Visit OT Treatments $Self Care/Home Management : 23-37 mins $Therapeutic Activity: 8-22 mins  Kasiya Burck H., OTR/L Acute Rehabilitation  Canna Nickelson Elane Ramla Hase 09/11/2020, 2:36 PM

## 2020-09-12 LAB — GLUCOSE, CAPILLARY
Glucose-Capillary: 147 mg/dL — ABNORMAL HIGH (ref 70–99)
Glucose-Capillary: 156 mg/dL — ABNORMAL HIGH (ref 70–99)
Glucose-Capillary: 159 mg/dL — ABNORMAL HIGH (ref 70–99)
Glucose-Capillary: 200 mg/dL — ABNORMAL HIGH (ref 70–99)
Glucose-Capillary: 218 mg/dL — ABNORMAL HIGH (ref 70–99)

## 2020-09-12 NOTE — Plan of Care (Signed)

## 2020-09-12 NOTE — TOC Progression Note (Addendum)
Transition of Care [facility]) - Progression Note    Patient Details  Name: Rebecca Kemp MRN: [id number] Date of Birth: 1955-05-16  Transition of Care [facility]) CM/SW Contact  Bess Kinds, RN Phone Number: [phone] 09/12/2020, 11:25 AM  Clinical Narrative:     Follow up to [employer] to discuss delivery of DME. [employer] are currently out of stock - [employer] to follow up on Monday with family to further discuss bed availability.   Patient is set up with [facility] for PT. Patient will need HH PT order with [facility]. TOC to fax orders to [place] at [phone].   PTAR to be arranged for transportation. Noted in previous CM note, patient to go to daughter, [place] home at [address] [place], [place] [address].  Expected Discharge Plan: Home w Home Health Services Barriers to Discharge: Continued Medical Work up  Expected Discharge Plan and Services Expected Discharge Plan: Home w Home Health Services   Discharge Planning Services: CM Consult Post Acute Care Choice: Home Health, Durable Medical Equipment Living arrangements for the past 2 months: Mobile Home                 DME Arranged: 3-N-1, Walker rolling, Hospital bed, Wheelchair manual DME Agency: [employer] Date DME Agency Contacted: 09/08/20 Time DME Agency Contacted: 1242 Representative spoke with at DME Agency: Velna Hatchet HH Arranged: PT, OT, Nurse's Aide HH Agency: [facility] Date [facility] Agency Contacted: 09/08/20 Time HH Agency Contacted: 1242 Representative spoke with at [facility] Agency: via Patton Salles.   Social Determinants of Health (SDOH) Interventions    Readmission Risk Interventions No flowsheet data found.

## 2020-09-12 NOTE — Plan of Care (Signed)

## 2020-09-12 NOTE — Progress Notes (Signed)
PROGRESS NOTE  Rebecca Kemp DOB: 06-22-1955 DOA: 09/01/2020 PCP: Pcp, No   LOS: 11 days   Brief Narrative / Interim history: Rebecca Kemp is a 65 y.o. female with medical history significant of hypertension, asthma, type 2 diabetes, GERD presented to [facility] ED via EMS for evaluation of right leg injury and inability to ambulate after a mechanical fall at a grocery store.  She was found to have a right femur fracture and is status post ORIF on 6/30  Subjective / 24h Interval events: No complaints this morning  Assessment & Plan: Principal Problem Right femur fracture following fall-orthopedic surgery consulted and followed patient while hospitalized.  She is status post ORIF on 09/03/2020.  Physical therapy recommended SNF however given that there is a co-pay patient does not want to go there.  Recommendations have been changed to CIR however patient refusing and wants to go home. Awaiting full setup ready prior to dc, items have not yet been delivered.  Apparently home health agency called today and they do not have the right equipment and they will not be delivering until Monday  Active Problems Acute metabolic encephalopathy, acute hypercarbic respiratory failure-happened on admission, likely in the setting of narcotic use, hypercarbia given elevated PCO2.  Resolved, she is back to baseline today.  She has a history of OSA, has been on CPAP about 10 years ago but currently not using it.  She will need a sleep study as an outpatient CKD 3b- currently at baseline Anemia of chronic disease, acute blood loss anemia-hemoglobin stable. Type 2 diabetes mellitus, diabetic neuropathy-A1c 6.8, continue insulin, gabapentin Obesity, class III-BMI 60, she would benefit from weight loss  Scheduled Meds:  amLODipine  5 mg Oral Daily   cholecalciferol  2,000 Units Oral Daily   docusate sodium  100 mg Oral BID   enoxaparin (LOVENOX) injection  100 mg Subcutaneous Q24H   gabapentin  400  mg Oral QHS   insulin aspart  0-9 Units Subcutaneous Q4H   irbesartan  300 mg Oral Daily   lactulose  20 g Oral BID   tiZANidine  4 mg Oral QHS   vitamin B-12  500 mcg Oral Daily   Vitamin D (Ergocalciferol)  50,000 Units Oral Q7 days   Continuous Infusions: PRN Meds:.acetaminophen **OR** acetaminophen, albuterol, diphenhydrAMINE, metoCLOPramide **OR** metoCLOPramide (REGLAN) injection, ondansetron **OR** ondansetron (ZOFRAN) IV, oxyCODONE-acetaminophen, polyethylene glycol  Diet Orders (From admission, onward)     Start     Ordered   09/05/20 0757  Diet Heart Room service appropriate? No; Fluid consistency: Thin  Diet effective now       Question Answer Comment  Room service appropriate? No   Fluid consistency: Thin      09/05/20 0756            DVT prophylaxis: SCDs Start: 09/03/20 1856 SCDs Start: 09/02/20 0305    Code Status: Full Code  Family Communication: No family at bedside, discussed with daughter over the phone  Status is: Inpatient  Remains inpatient appropriate because:Inpatient level of care appropriate due to severity of illness  Dispo: The patient is from: Home              Anticipated d/c is to: SNF              Patient currently is not medically stable to d/c.   Difficult to place patient No  Level of care: Med-Surg  Consultants:  Orthopedic surgery  Procedures:  ORIF femur fracture  Microbiology  None   Antimicrobials: None     Objective: Vitals:   09/11/20 0750 09/11/20 1456 09/11/20 2020 09/12/20 0804  BP: (!) 121/45 (!) 103/36 (!) 156/63 (!) 108/44  Pulse: 90 82 85 84  Resp:  14 17 17   Temp: 98.4 F (36.9 C) 98.1 F (36.7 C) 98.6 F (37 C) 98.4 F (36.9 C)  TempSrc: Oral Oral  Oral  SpO2: 98% 98% 98% 95%  Weight:      Height:        Intake/Output Summary (Last 24 hours) at 09/12/2020 1109 Last data filed at 09/11/2020 2100 Gross per 24 hour  Intake 240 ml  Output --  Net 240 ml    Filed Weights   09/02/20 0025   Weight: (!) 196 kg    Examination:  Constitutional: No distress Respiratory: Clear bilaterally Cardiovascular: Regular rate and rhythm, no edema  Data Reviewed: I have independently reviewed following labs and imaging studies   CBC: Recent Labs  Lab 09/08/20 0339  WBC 5.9  NEUTROABS 3.2  HGB 9.4*  HCT 29.3*  MCV 94.5  PLT 294    Basic Metabolic Panel: Recent Labs  Lab 09/06/20 0134 09/08/20 0339 09/10/20 0611  NA 140 142  --   K 4.0 3.8  --   CL 104 98  --   CO2 27 35*  --   GLUCOSE 135* 153*  --   BUN 30* 25*  --   CREATININE 1.29* 1.17* 1.26*  CALCIUM 9.1 9.8  --     Liver Function Tests: Recent Labs  Lab 09/08/20 0339  AST 21  ALT 12  ALKPHOS 71  BILITOT 1.0  PROT 6.2*  ALBUMIN 2.3*    Coagulation Profile: No results for input(s): INR, PROTIME in the last 168 hours.  HbA1C: No results for input(s): HGBA1C in the last 72 hours. CBG: Recent Labs  Lab 09/11/20 1110 09/11/20 1637 09/11/20 2055 09/12/20 0004 09/12/20 0409  GLUCAP 154* 166* 218* 200* 147*     Recent Results (from the past 240 hour(s))  Surgical PCR screen     Status: None   Collection Time: 09/02/20  4:40 PM   Specimen: Nasal Mucosa; Nasal Swab  Result Value Ref Range Status   MRSA, PCR NEGATIVE NEGATIVE Final   Staphylococcus aureus NEGATIVE NEGATIVE Final    Comment: (NOTE) The Xpert SA Assay (FDA approved for NASAL specimens in patients 40 years of age and older), is one component of a comprehensive surveillance program. It is not intended to diagnose infection nor to guide or monitor treatment. Performed at [facility], [address] [place], [place]       Radiology Studies: No results found.  [id number], MD, PhD [facility]  Between 7 am - 7 pm I am available, please contact me via Amion (for emergencies) or Securechat (non urgent messages)  Between 7 pm - 7 am I am not available, please contact night coverage MD/APP via  Amion

## 2020-09-12 NOTE — Progress Notes (Signed)
Physical Therapy Treatment Patient Details Name: Rebecca Kemp MRN: [id number] DOB: 07/23/1955 Today's Date: 09/12/2020    History of Present Illness Pt is a 65 y/o female admitted 6/28 after suffering a mechanical fall at the grocery store.  Pt s/p ORIF of right intra-articular distal femur fracture.  Pt initially with encephalopathy that is improving.  PMHx:  HTN, DM2.    PT Comments    Pt in bed and agreeable to participate with therapy despite pain. Pt performed LE HEP and issued handout prior to mobilizing OOB via sliding board transfer. Once in recliner chair. Pt attempted to stand with elevated bed in front to allow pt to pull up on bed rail. Pt able to clear buttocks off recliner chair, but could not transition R UE from recliner to bed to come fully upright. Pt became tearful and fearful, saying she did not feel she could try again today. Plan to continue progressing transfers and pregait activities next session. Will continue to follow acutely.    Follow Up Recommendations  CIR (if unable to go will need HHPT)     Equipment Recommendations  Hospital bed;Other (comment) ((Bariatric w/c with drop arm; 3 in 1 bariatric with drop arm; hospital bed; hoyer lift; bariatric RW))    Recommendations for Other Services       Precautions / Restrictions Precautions Precautions: Fall Restrictions Weight Bearing Restrictions: Yes RLE Weight Bearing: Touchdown weight bearing    Mobility  Bed Mobility Overal bed mobility: Needs Assistance Bed Mobility: Supine to Sit     Supine to sit: Min assist     General bed mobility comments: Min A at chuck. Pt required increased time and effort with HOB elevated and use of bed rails    Transfers Overall transfer level: Needs assistance Equipment used: Sliding board Transfers: Lateral/Scoot Transfers;Sit to/from Stand Sit to Stand: Max assist;+2 physical assistance        Lateral/Scoot Transfers: With slide board;Min assist;+2  safety/equipment General transfer comment: Pt able to perform lateral scoot from EOB to drop arm recliner with min A +2 for safety. VC for hand placement. Attempted to stand from recliner chair with elevated bed in front to allow pt to pull up on bed rail. Pt able to rise enough that hips left chair, however could not transition R UE from chair to bed to come fully upright.  Ambulation/Gait                 Stairs             Wheelchair Mobility    Modified Rankin (Stroke Patients Only)       Balance Overall balance assessment: Needs assistance Sitting-balance support: Feet supported;No upper extremity supported Sitting balance-Leahy Scale: Good   Postural control: Posterior lean;Left lateral lean Standing balance support: Bilateral upper extremity supported Standing balance-Leahy Scale: Zero Standing balance comment: unable                            Cognition Arousal/Alertness: Awake/alert Behavior During Therapy: WFL for tasks assessed/performed Overall Cognitive Status: Within Functional Limits for tasks assessed                                 General Comments: fearful of falling again. Tearful at the prospect of standing again.      Exercises General Exercises - Lower Extremity Ankle Circles/Pumps: AROM;Both;10 reps;Supine Quad Sets: AROM;Right;5  reps;Supine Short Arc Quad: AROM;Right;5 reps;Supine Heel Slides: AROM;Right;5 reps;Supine Hip ABduction/ADduction: AROM;Right;5 reps;Supine Straight Leg Raises: AROM;Right;5 reps;Supine    General Comments        Pertinent Vitals/Pain Pain Assessment: 0-10 Faces Pain Scale: Hurts even more Pain Location: R LE with transfers Pain Descriptors / Indicators: Grimacing;Guarding Pain Intervention(s): Monitored during session;Limited activity within patient's tolerance;Repositioned    Home Living                      Prior Function            PT Goals (current goals  can now be found in the care plan section) Acute Rehab PT Goals Patient Stated Goal: to go home PT Goal Formulation: With patient Time For Goal Achievement: 09/18/20 Potential to Achieve Goals: Good Progress towards PT goals: Progressing toward goals    Frequency    Min 4X/week      PT Plan Current plan remains appropriate    Co-evaluation              AM-PAC PT "6 Clicks" Mobility   Outcome Measure  Help needed turning from your back to your side while in a flat bed without using bedrails?: A Little Help needed moving from lying on your back to sitting on the side of a flat bed without using bedrails?: A Little Help needed moving to and from a bed to a chair (including a wheelchair)?: A Little Help needed standing up from a chair using your arms (e.g., wheelchair or bedside chair)?: Total Help needed to walk in hospital room?: Total Help needed climbing 3-5 steps with a railing? : Total 6 Click Score: 12    End of Session Equipment Utilized During Treatment: Gait belt Activity Tolerance: Patient tolerated treatment well Patient left: with call bell/phone within reach;in chair Nurse Communication: Mobility status PT Visit Diagnosis: Other abnormalities of gait and mobility (R26.89);Muscle weakness (generalized) (M62.81);Pain Pain - Right/Left: Right Pain - part of body: Leg     Time: 1610-9604 PT Time Calculation (min) (ACUTE ONLY): 47 min  Charges:  $Therapeutic Exercise: 8-22 mins $Therapeutic Activity: 23-37 mins                     Kallie Locks, [place] Pager [phone] Acute Rehab   Sheral Apley 09/12/2020, 2:26 PM

## 2020-09-13 LAB — GLUCOSE, CAPILLARY
Glucose-Capillary: 156 mg/dL — ABNORMAL HIGH (ref 70–99)
Glucose-Capillary: 168 mg/dL — ABNORMAL HIGH (ref 70–99)
Glucose-Capillary: 171 mg/dL — ABNORMAL HIGH (ref 70–99)
Glucose-Capillary: 179 mg/dL — ABNORMAL HIGH (ref 70–99)
Glucose-Capillary: 182 mg/dL — ABNORMAL HIGH (ref 70–99)
Glucose-Capillary: 191 mg/dL — ABNORMAL HIGH (ref 70–99)

## 2020-09-13 LAB — BASIC METABOLIC PANEL
Anion gap: 7 (ref 5–15)
BUN: 24 mg/dL — ABNORMAL HIGH (ref 8–23)
CO2: 30 mmol/L (ref 22–32)
Calcium: 9.8 mg/dL (ref 8.9–10.3)
Chloride: 100 mmol/L (ref 98–111)
Creatinine, Ser: 1.13 mg/dL — ABNORMAL HIGH (ref 0.44–1.00)
GFR, Estimated: 55 mL/min — ABNORMAL LOW (ref >60)
Glucose, Bld: 211 mg/dL — ABNORMAL HIGH (ref 70–99)
Potassium: 4 mmol/L (ref 3.5–5.1)
Sodium: 137 mmol/L (ref 135–145)

## 2020-09-13 NOTE — Plan of Care (Signed)

## 2020-09-13 NOTE — Progress Notes (Signed)
Physical Therapy Treatment Patient Details Name: Rebecca Kemp MRN: [id number] DOB: October 24, 1955 Today's Date: 09/13/2020    History of Present Illness Pt is a 65 y/o female admitted 6/28 after suffering a mechanical fall at the grocery store.  Pt s/p ORIF of right intra-articular distal femur fracture.  Pt initially with encephalopathy that is improving.  PMHx:  HTN, DM2.    PT Comments    Pt expressing goal to sit EOB and wash up today. Pt overall requiring min assist for bed mobility tasks, and is supervision for multidirectional reaching/leaning in sitting during wash up. Pt tolerated EOB sitting >10 minutes, and light LE exercise. Pt declines transfer attempt at this time, reports fatigue. Will continue to follow.   Follow Up Recommendations  CIR (if unable to go will need HHPT)     Equipment Recommendations  Hospital bed;Other (comment) ((Bariatric w/c with drop arm; 3 in 1 bariatric with drop arm; hospital bed; hoyer lift; bariatric RW))    Recommendations for Other Services       Precautions / Restrictions Precautions Precautions: Fall Restrictions Weight Bearing Restrictions: Yes RLE Weight Bearing: Touchdown weight bearing    Mobility  Bed Mobility Overal bed mobility: Needs Assistance Bed Mobility: Supine to Sit;Sit to Supine     Supine to sit: Min assist Sit to supine: Min assist   General bed mobility comments: min assist for RLE management, trunk elevation when moving supine>sit.    Transfers                 General transfer comment: pt declines  Ambulation/Gait                 Stairs             Wheelchair Mobility    Modified Rankin (Stroke Patients Only)       Balance Overall balance assessment: Needs assistance Sitting-balance support: Feet supported;No upper extremity supported Sitting balance-Leahy Scale: Good                                      Cognition Arousal/Alertness: Awake/alert Behavior  During Therapy: WFL for tasks assessed/performed Overall Cognitive Status: Within Functional Limits for tasks assessed                                        Exercises General Exercises - Lower Extremity Long Arc Quad: AROM;Both;10 reps;Seated    General Comments        Pertinent Vitals/Pain Pain Assessment: Faces Faces Pain Scale: Hurts even more Pain Location: RLE, when in dependent position Pain Descriptors / Indicators: Grimacing;Guarding Pain Intervention(s): Limited activity within patient's tolerance;Monitored during session;Repositioned    Home Living                      Prior Function            PT Goals (current goals can now be found in the care plan section) Acute Rehab PT Goals Patient Stated Goal: to go home PT Goal Formulation: With patient Time For Goal Achievement: 09/18/20 Potential to Achieve Goals: Good Progress towards PT goals: Progressing toward goals    Frequency    Min 4X/week      PT Plan Current plan remains appropriate    Co-evaluation  AM-PAC PT "6 Clicks" Mobility   Outcome Measure  Help needed turning from your back to your side while in a flat bed without using bedrails?: A Little Help needed moving from lying on your back to sitting on the side of a flat bed without using bedrails?: A Little Help needed moving to and from a bed to a chair (including a wheelchair)?: A Lot Help needed standing up from a chair using your arms (e.g., wheelchair or bedside chair)?: Total Help needed to walk in hospital room?: Total Help needed climbing 3-5 steps with a railing? : Total 6 Click Score: 11    End of Session   Activity Tolerance: Patient tolerated treatment well Patient left: with call bell/phone within reach;in bed;with bed alarm set   PT Visit Diagnosis: Other abnormalities of gait and mobility (R26.89);Muscle weakness (generalized) (M62.81);Pain Pain - Right/Left: Right Pain - part  of body: Leg     Time: 2025-4270 PT Time Calculation (min) (ACUTE ONLY): 25 min  Charges:  $Therapeutic Activity: 8-22 mins $Neuromuscular Re-education: 8-22 mins                     Marye Round, PT DPT Acute Rehabilitation Services Pager [phone]  Office [phone]    Tyrone Apple E Christain [place] 09/13/2020, 2:53 PM

## 2020-09-13 NOTE — Progress Notes (Signed)
PROGRESS NOTE  [name] VQQ:[id number] DOB: 06/27/1955 DOA: 09/01/2020 PCP: Pcp, No   LOS: 12 days   Brief Narrative / Interim history: AHSLEY Kemp is a 65 y.o. female with medical history significant of hypertension, asthma, type 2 diabetes, GERD presented to [facility] ED via EMS for evaluation of right leg injury and inability to ambulate after a mechanical fall at a grocery store.  She was found to have a right femur fracture and is status post ORIF on 6/30  Subjective / 24h Interval events: No complaints  Assessment & Plan: Principal Problem Right femur fracture following fall-orthopedic surgery consulted and followed patient while hospitalized.  She is status post ORIF on 09/03/2020.  Physical therapy recommended SNF however given that there is a co-pay patient does not want to go there.  Recommendations have been changed to CIR however patient refusing and wants to go home. Awaiting full setup ready prior to dc, items have not yet been delivered.  Apparently home health agency called today and they do not have the right equipment and they will not be delivering until Monday  Active Problems Acute metabolic encephalopathy, acute hypercarbic respiratory failure-happened on admission, likely in the setting of narcotic use, hypercarbia given elevated PCO2.  Resolved, she is back to baseline today.  She has a history of OSA, has been on CPAP about 10 years ago but currently not using it.  She will need a sleep study as an outpatient CKD 3b- currently at baseline Anemia of chronic disease, acute blood loss anemia-hemoglobin stable. Type 2 diabetes mellitus, diabetic neuropathy-A1c 6.8, continue insulin, gabapentin Obesity, class III-BMI 60, she would benefit from weight loss  Scheduled Meds:  amLODipine  5 mg Oral Daily   cholecalciferol  2,000 Units Oral Daily   docusate sodium  100 mg Oral BID   enoxaparin (LOVENOX) injection  100 mg Subcutaneous Q24H   gabapentin  400 mg Oral QHS    insulin aspart  0-9 Units Subcutaneous Q4H   irbesartan  300 mg Oral Daily   lactulose  20 g Oral BID   tiZANidine  4 mg Oral QHS   vitamin B-12  500 mcg Oral Daily   Vitamin D (Ergocalciferol)  50,000 Units Oral Q7 days   Continuous Infusions: PRN Meds:.acetaminophen **OR** acetaminophen, albuterol, diphenhydrAMINE, metoCLOPramide **OR** metoCLOPramide (REGLAN) injection, ondansetron **OR** ondansetron (ZOFRAN) IV, oxyCODONE-acetaminophen, polyethylene glycol  Diet Orders (From admission, onward)     Start     Ordered   09/05/20 0757  Diet Heart Room service appropriate? No; Fluid consistency: Thin  Diet effective now       Question Answer Comment  Room service appropriate? No   Fluid consistency: Thin      09/05/20 0756            DVT prophylaxis: SCDs Start: 09/03/20 1856 SCDs Start: 09/02/20 0305    Code Status: Full Code  Family Communication: No family at bedside, discussed with daughter over the phone  Status is: Inpatient  Remains inpatient appropriate because:Inpatient level of care appropriate due to severity of illness  Dispo: The patient is from: Home              Anticipated d/c is to: SNF              Patient currently is not medically stable to d/c.   Difficult to place patient No  Level of care: Med-Surg  Consultants:  Orthopedic surgery  Procedures:  ORIF femur fracture  Microbiology  None  Antimicrobials: None     Objective: Vitals:   09/12/20 0804 09/12/20 1500 09/12/20 1947 09/13/20 0756  BP: (!) 108/44 (!) 133/58 (!) 150/83 (!) 130/59  Pulse: 84 79 86 84  Resp: 17 18 17 14   Temp: 98.4 F (36.9 C) 98.7 F (37.1 C) 98 F (36.7 C) 98.3 F (36.8 C)  TempSrc: Oral Oral Oral Oral  SpO2: 95% 96% 96% 99%  Weight:      Height:        Intake/Output Summary (Last 24 hours) at 09/13/2020 1146 Last data filed at 09/13/2020 0900 Gross per 24 hour  Intake --  Output 1001 ml  Net -1001 ml    Filed Weights   09/02/20 0025   Weight: (!) 196 kg    Examination:  Constitutional: NAD Respiratory: CTA Cardiovascular: rrr, no edema  Data Reviewed: I have independently reviewed following labs and imaging studies   CBC: Recent Labs  Lab 09/08/20 0339  WBC 5.9  NEUTROABS 3.2  HGB 9.4*  HCT 29.3*  MCV 94.5  PLT 294    Basic Metabolic Panel: Recent Labs  Lab 09/08/20 0339 09/10/20 0611 09/13/20 0846  NA 142  --  137  K 3.8  --  4.0  CL 98  --  100  CO2 35*  --  30  GLUCOSE 153*  --  211*  BUN 25*  --  24*  CREATININE 1.17* 1.26* 1.13*  CALCIUM 9.8  --  9.8    Liver Function Tests: Recent Labs  Lab 09/08/20 0339  AST 21  ALT 12  ALKPHOS 71  BILITOT 1.0  PROT 6.2*  ALBUMIN 2.3*    Coagulation Profile: No results for input(s): INR, PROTIME in the last 168 hours.  HbA1C: No results for input(s): HGBA1C in the last 72 hours. CBG: Recent Labs  Lab 09/12/20 1731 09/12/20 1949 09/13/20 0007 09/13/20 0402 09/13/20 0819  GLUCAP 159* 156* 168* 156* 191*     No results found for this or any previous visit (from the past 240 hour(s)).     Radiology Studies: No results found.  11/14/20, MD, PhD [facility]  Between 7 am - 7 pm I am available, please contact me via Amion (for emergencies) or Securechat (non urgent messages)  Between 7 pm - 7 am I am not available, please contact night coverage MD/APP via Amion

## 2020-09-13 NOTE — Plan of Care (Signed)
  Problem: Activity: Goal: Risk for activity intolerance will decrease Outcome: Progressing   Problem: Coping: Goal: Level of anxiety will decrease Outcome: Progressing   Problem: Pain Managment: Goal: General experience of comfort will improve Outcome: Progressing   Problem: Safety: Goal: Ability to remain free from injury will improve Outcome: Progressing   Problem: Skin Integrity: Goal: Risk for impaired skin integrity will decrease Outcome: Progressing   

## 2020-09-14 LAB — GLUCOSE, CAPILLARY
Glucose-Capillary: 164 mg/dL — ABNORMAL HIGH (ref 70–99)
Glucose-Capillary: 133 mg/dL — ABNORMAL HIGH (ref 70–99)
Glucose-Capillary: 130 mg/dL — ABNORMAL HIGH (ref 70–99)
Glucose-Capillary: 148 mg/dL — ABNORMAL HIGH (ref 70–99)
Glucose-Capillary: 161 mg/dL — ABNORMAL HIGH (ref 70–99)
Glucose-Capillary: 219 mg/dL — ABNORMAL HIGH (ref 70–99)

## 2020-09-14 MED ORDER — OXYCODONE HCL 5 MG PO TABS
10.0000 mg | ORAL_TABLET | ORAL | Status: DC | PRN
  Administered 2020-09-14 – 2020-09-15 (×6): 10 mg via ORAL
  Filled 2020-09-14 (×7): qty 2

## 2020-09-14 NOTE — Plan of Care (Signed)

## 2020-09-14 NOTE — Progress Notes (Signed)
Occupational Therapy Treatment Patient Details Name: Rebecca Kemp MRN: [id number] DOB: [birth year] Today's Date: 09/14/2020    History of present illness Pt is a 65 y/o female admitted 6/28 after suffering a mechanical fall at the grocery store.  Pt s/p ORIF of right intra-articular distal femur fracture.  Pt initially with encephalopathy that is improving.  PMHx:  HTN, DM2.   OT comments  This 65 yo female doing well with bed mobility. Today focused on working on sit<>stands on LLE only (TDWB on RLE) to work on keeping LLE strong so when she can ambulate she will have strength in LLE to do so. She reported she did feel weak in this leg and was really surprised by this. She will continue to benefit from acute OT with follow up HHOT.  Follow Up Recommendations  Home health OT;Supervision/Assistance - 24 hour    Equipment Recommendations  Wheelchair (measurements OT);Wheelchair cushion (measurements OT);Hospital bed          Precautions / Restrictions Precautions Precautions: Fall Restrictions Weight Bearing Restrictions: Yes RLE Weight Bearing: Touchdown weight bearing       Mobility Bed Mobility Overal bed mobility: Needs Assistance Bed Mobility: Supine to Sit;Sit to Supine     Supine to sit: Supervision Sit to supine: Supervision   General bed mobility comments: Pt able to move out of bed and back to bed without assistance and increased time.    Transfers Overall transfer level: Needs assistance Equipment used:  (held to back of recliner to rise to stand and to sit from standing)   Sit to Stand: Mod assist;+2 physical assistance;From elevated surface (pt standing on 4 inch block on left foot with height of bed also elevated to try and offset her putting too much weight through RLE)         General transfer comment: Pt able to rise into standing x 2 trials with L foot on 4 inch step and OT monitoring weight bearing on R side.  Pt able to lift hips and maintain  standing.  Hip extension is improved on 2nd trial.  Pt only able to stand for 5-10 seconds noted fatigue on L quad in standing.    Balance Overall balance assessment: Needs assistance Sitting-balance support: Feet supported;No upper extremity supported Sitting balance-Leahy Scale: Good     Standing balance support: Bilateral upper extremity supported Standing balance-Leahy Scale: Poor Standing balance comment: heavy reliance on external assistance to rise into standing.                           ADL either performed or assessed with clinical judgement   ADL       Grooming: Set up;Oral care;Sitting (EOB)                                 General ADL Comments: Discussed with pt how she would get in and out of her house at home with 2 steps. 2 people could bump her up and down them or they could do a make shift ramp. We also dicussed getting in and out of a car (front passenger and back seat)     Vision Patient Visual Report: No change from baseline            Cognition Arousal/Alertness: Awake/alert Behavior During Therapy: WFL for tasks assessed/performed Overall Cognitive Status: Within Functional Limits for tasks assessed  Pertinent Vitals/ Pain       Pain Assessment: 0-10 Pain Score: 8  Pain Location: RLE, when in dependent position Pain Descriptors / Indicators: Grimacing;Guarding Pain Intervention(s): Monitored during session;Repositioned;Patient requesting pain meds-RN notified;RN gave pain meds during session         Frequency  Min 2X/week        Progress Toward Goals  OT Goals(current goals can now be found in the care plan section)  Progress towards OT goals: Progressing toward goals  Acute Rehab OT Goals Patient Stated Goal: to go home OT Goal Formulation: With patient Time For Goal Achievement: 09/18/20 Potential to Achieve Goals: Good  Plan  Discharge plan remains appropriate    Co-evaluation    PT/OT/SLP Co-Evaluation/Treatment: Yes Reason for Co-Treatment: Complexity of the patient's impairments (multi-system involvement);For patient/therapist safety PT goals addressed during session: Mobility/safety with mobility OT goals addressed during session: Strengthening/ROM;ADL's and self-care      AM-PAC OT "6 Clicks" Daily Activity     Outcome Measure   Help from another person eating meals?: None Help from another person taking care of personal grooming?: A Little Help from another person toileting, which includes using toliet, bedpan, or urinal?: A Lot Help from another person bathing (including washing, rinsing, drying)?: A Lot Help from another person to put on and taking off regular upper body clothing?: A Little Help from another person to put on and taking off regular lower body clothing?: A Lot 6 Click Score: 16    End of Session Equipment Utilized During Treatment: Gait belt  OT Visit Diagnosis: Unsteadiness on feet (R26.81);Other abnormalities of gait and mobility (R26.89);Muscle weakness (generalized) (M62.81);History of falling (Z91.81);Pain Pain - Right/Left: Right Pain - part of body: Leg   Activity Tolerance Patient tolerated treatment well   Patient Left in bed;with call bell/phone within reach;with bed alarm set   Nurse Communication Patient requests pain meds        Time: 6644-0347 OT Time Calculation (min): 61 min  Charges: OT General Charges $OT Visit: 1 Visit OT Treatments $Self Care/Home Management : 23-37 mins  Ignacia Palma, OTR/L Acute [employer] Pager [phone] Office [phone]     Evette Georges 09/14/2020, 5:38 PM

## 2020-09-14 NOTE — TOC Progression Note (Addendum)
Transition of Care [facility]) - Progression Note    Patient Details  Name: Rebecca Kemp MRN: [id number] Date of Birth: March 21, 1955  [facility] CM/SW Contact  Nadene Rubins Adria Devon, RN Phone Number: 09/14/2020, 12:05 PM  Clinical Narrative:    NCM entered home health orders.   Spoke to [place] with [facility]. They do not have any bariatric hospital beds in stock. They have ordered bariatric hospital beds but do not have a date on delivery. They do have transfer board, hoyer lift walker, 3 in 1 and wheel chair in stock .  NCM called Jermain with [employer] . He will check on his supply of above DME and call NCM back. [name] has a bariatric bed .   NCM spoke to patient at bedside AND [facility] via speaker phone [phone] .   Velna Hatchet with [employer] stated delivery arranged for tomorrow, [name] was unaware. NCM called Velna Hatchet back , [facility] will call [facility] will call [name] also regarding delivery of bed.   Confirmed address [address] [place], [place] [address]  Home health PT orders and face to face faxed to [facility] for PT. Patient will need HH PT order with Face to Face. TOC to fax orders to [place] at [phone].  Expected Discharge Plan: Home w Home Health Services Barriers to Discharge: Continued Medical Work up  Expected Discharge Plan and Services Expected Discharge Plan: Home w Home Health Services   Discharge Planning Services: CM Consult Post Acute Care Choice: Home Health, Durable Medical Equipment Living arrangements for the past 2 months: Mobile Home                 DME Arranged: 3-N-1, Walker rolling, Hospital bed, Wheelchair manual DME Agency: [facility] Date DME Agency Contacted: 09/08/20 Time DME Agency Contacted: 1242 Representative spoke with at DME Agency: Velna Hatchet HH Arranged: PT, OT, Nurse's Aide HH Agency: [facility] Date [facility] Agency Contacted: 09/08/20 Time HH Agency Contacted: 1242 Representative spoke with at [facility] Agency: via Patton Salles.   Social Determinants of Health (SDOH) Interventions    Readmission Risk Interventions No flowsheet data found.

## 2020-09-14 NOTE — Progress Notes (Addendum)
Orthopaedic Trauma Progress Note  SUBJECTIVE: Doing okay today, is really anxious about going home with her daughter.  Has some intermittent muscle cramps in her thigh which is to be expected.  We reviewed her x-rays together and all patient's questions were answered.    OBJECTIVE:  Vitals:   09/13/20 2109 09/14/20 0717  BP: (!) 118/53 112/61  Pulse: 94 77  Resp: 17 17  Temp: 99.1 F (37.3 C) 98.5 F (36.9 C)  SpO2: 93% 97%    General: Resting in bed, NAD Respiratory: No increased work of breathing.  RLE: Dressing removed, incisions are clean, dry, intact with Dermabond in place.  Mildly tender about knee and throughout the thigh as expected. Endorses sensation to light touch over toes. Wiggles toes.  Excellent ankle motion.  Foot warm and well perfused. +DP pulse  IMAGING: Stable post op imaging.   LABS:  Results for orders placed or performed during the hospital encounter of 09/01/20 (from the past 24 hour(s))  Glucose, capillary     Status: Abnormal   Collection Time: 09/13/20  4:32 PM  Result Value Ref Range   Glucose-Capillary 182 (H) 70 - 99 mg/dL  Glucose, capillary     Status: Abnormal   Collection Time: 09/13/20  8:29 PM  Result Value Ref Range   Glucose-Capillary 179 (H) 70 - 99 mg/dL  Glucose, capillary     Status: Abnormal   Collection Time: 09/14/20  2:24 AM  Result Value Ref Range   Glucose-Capillary 164 (H) 70 - 99 mg/dL  Glucose, capillary     Status: Abnormal   Collection Time: 09/14/20  4:03 AM  Result Value Ref Range   Glucose-Capillary 133 (H) 70 - 99 mg/dL  Glucose, capillary     Status: Abnormal   Collection Time: 09/14/20  7:22 AM  Result Value Ref Range   Glucose-Capillary 130 (H) 70 - 99 mg/dL  Glucose, capillary     Status: Abnormal   Collection Time: 09/14/20 11:58 AM  Result Value Ref Range   Glucose-Capillary 148 (H) 70 - 99 mg/dL    ASSESSMENT: Rebecca Kemp is a 65 y.o. female, 11 Days Post-Op s/p ORIF RIGHT DISTAL FEMUR  FRACTURE  CV/Blood loss: Hemoglobin stable, hemodynamically stable.  PLAN: Weightbearing: TDWB RLE ROM: Ok for unrestricted knee motion Incisional and dressing care: Okay to leave incisions open to air Showering: Okay to shower and get incisions wet. Orthopedic device(s): None  Pain management:  1. Tylenol 650 mg q 6 hours PRN 2. Oxycodone 10 mg q 4 hour PRN 3. Neurontin 400 mg TID VTE prophylaxis: Lovenox, SCDs ID:  Ancef 2gm post op Foley/Lines:  No foley, KVO IVFs Impediments to Fracture Healing: Vit D level 20, continue supplementation Dispo: Therapies as tolerated.  Plan for discharge to daughter's home today versus tomorrow with home health therapies.  Okay for discharge from Ortho standpoint. D/C recommendations: Continue Lovenox x14 days.  Continue vitamin D supplementation x90 days. Follow - up plan: 2 weeks after d/c for repeat x-rays and wound check  Contact information:  Truitt Merle MD, Ulyses Southward PA-C. After hours and holidays please check [email or web address] for group call information for [facility]   Donat Humble A. Michaelyn Barter, PA-C [phone] (office) [email or web address]

## 2020-09-14 NOTE — Progress Notes (Signed)
PROGRESS NOTE  Rebecca RUSSI QMV:[id number] DOB: 06-30-1955 DOA: 09/01/2020 PCP: Pcp, No   LOS: 13 days   Brief Narrative / Interim history: Rebecca Kemp is a 65 y.o. female with medical history significant of hypertension, asthma, type 2 diabetes, GERD presented to [facility] ED via EMS for evaluation of right leg injury and inability to ambulate after a mechanical fall at a grocery store.  She was found to have a right femur fracture and is status post ORIF on 6/30  Subjective / 24h Interval events: She is a bit anxious about going home   Assessment & Plan: Principal Problem Right femur fracture following fall-orthopedic surgery consulted and followed patient while hospitalized.  She is status post ORIF on 09/03/2020.  Physical therapy recommended SNF however given that there is a co-pay patient does not want to go there.  Recommendations have been changed to CIR however patient refusing and wants to go home. Awaiting full setup ready prior to dc, items have not yet been delivered.  -Home equipment not delivered yet  Active Problems Acute metabolic encephalopathy, acute hypercarbic respiratory failure-happened on admission, likely in the setting of narcotic use, hypercarbia given elevated PCO2.  Resolved, she is back to baseline today.  She has a history of OSA, has been on CPAP about 10 years ago but currently not using it.  She will need a sleep study as an outpatient CKD 3b- currently at baseline Anemia of chronic disease, acute blood loss anemia-hemoglobin stable. Type 2 diabetes mellitus, diabetic neuropathy-A1c 6.8, continue insulin, gabapentin Obesity, class III-BMI 60, she would benefit from weight loss  Scheduled Meds:  amLODipine  5 mg Oral Daily   cholecalciferol  2,000 Units Oral Daily   docusate sodium  100 mg Oral BID   enoxaparin (LOVENOX) injection  100 mg Subcutaneous Q24H   gabapentin  400 mg Oral QHS   insulin aspart  0-9 Units Subcutaneous Q4H   irbesartan  300 mg  Oral Daily   lactulose  20 g Oral BID   tiZANidine  4 mg Oral QHS   vitamin B-12  500 mcg Oral Daily   Vitamin D (Ergocalciferol)  50,000 Units Oral Q7 days   Continuous Infusions: PRN Meds:.acetaminophen **OR** acetaminophen, albuterol, diphenhydrAMINE, metoCLOPramide **OR** metoCLOPramide (REGLAN) injection, ondansetron **OR** ondansetron (ZOFRAN) IV, oxyCODONE, polyethylene glycol  Diet Orders (From admission, onward)     Start     Ordered   09/05/20 0757  Diet Heart Room service appropriate? No; Fluid consistency: Thin  Diet effective now       Question Answer Comment  Room service appropriate? No   Fluid consistency: Thin      09/05/20 0756            DVT prophylaxis: SCDs Start: 09/03/20 1856 SCDs Start: 09/02/20 0305    Code Status: Full Code  Family Communication: No family at bedside  Status is: Inpatient  Remains inpatient appropriate because:Inpatient level of care appropriate due to severity of illness  Dispo: The patient is from: Home              Anticipated d/c is to: SNF              Patient currently is not medically stable to d/c.   Difficult to place patient No  Level of care: Med-Surg  Consultants:  Orthopedic surgery  Procedures:  ORIF femur fracture  Microbiology  None   Antimicrobials: None     Objective: Vitals:   09/13/20 1511 09/13/20 1520 09/13/20  2109 09/14/20 0717  BP: (!) 89/51 115/61 (!) 118/53 112/61  Pulse: 85  94 77  Resp: 15  17 17   Temp: 98.5 F (36.9 C)  99.1 F (37.3 C) 98.5 F (36.9 C)  TempSrc: Oral  Oral Oral  SpO2: 96%  93% 97%  Weight:      Height:        Intake/Output Summary (Last 24 hours) at 09/14/2020 1349 Last data filed at 09/14/2020 0900 Gross per 24 hour  Intake 360 ml  Output --  Net 360 ml    Filed Weights   09/02/20 0025  Weight: (!) 196 kg    Examination:  Constitutional: NAD Respiratory: CTA Cardiovascular: Regular rate and rhythm  Data Reviewed: I have independently  reviewed following labs and imaging studies   CBC: Recent Labs  Lab 09/08/20 0339  WBC 5.9  NEUTROABS 3.2  HGB 9.4*  HCT 29.3*  MCV 94.5  PLT 294    Basic Metabolic Panel: Recent Labs  Lab 09/08/20 0339 09/10/20 0611 09/13/20 0846  NA 142  --  137  K 3.8  --  4.0  CL 98  --  100  CO2 35*  --  30  GLUCOSE 153*  --  211*  BUN 25*  --  24*  CREATININE 1.17* 1.26* 1.13*  CALCIUM 9.8  --  9.8    Liver Function Tests: Recent Labs  Lab 09/08/20 0339  AST 21  ALT 12  ALKPHOS 71  BILITOT 1.0  PROT 6.2*  ALBUMIN 2.3*    Coagulation Profile: No results for input(s): INR, PROTIME in the last 168 hours.  HbA1C: No results for input(s): HGBA1C in the last 72 hours. CBG: Recent Labs  Lab 09/13/20 2029 09/14/20 0224 09/14/20 0403 09/14/20 0722 09/14/20 1158  GLUCAP 179* 164* 133* 130* 148*     No results found for this or any previous visit (from the past 240 hour(s)).     Radiology Studies: No results found.  11/15/20, MD, PhD [facility]  Between 7 am - 7 pm I am available, please contact me via Amion (for emergencies) or Securechat (non urgent messages)  Between 7 pm - 7 am I am not available, please contact night coverage MD/APP via Amion

## 2020-09-14 NOTE — Progress Notes (Addendum)
Physical Therapy Treatment Patient Details Name: Rebecca Kemp MRN: [id number] DOB: February 24, 1956 Today's Date: 09/14/2020    History of Present Illness Pt is a 65 y/o female admitted 6/28 after suffering a mechanical fall at the grocery store.  Pt s/p ORIF of right intra-articular distal femur fracture.  Pt initially with encephalopathy that is improving.  PMHx:  HTN, DM2.    PT Comments    Supine in bed on arrival.  Focus of session to progress to standing as she has relied solely on slide board lateral transfers for OOB.  She presents with poor standing tolerance but was able to lift hips from edge of bed ( elevated with 4 inch steps under her L foot to help maintain weight bearing).  Continue to recommend aggressive rehab but it looks like patient has decided to go home and will need DME listed below.    Follow Up Recommendations  CIR (if unable will need HHPT)     Equipment Recommendations  Hospital bed;Other (comment) (Bariatric w/c with drop arm; 3 in 1 bariatric with drop arm; hospital bed; hoyer lift; bariatric RW)    Recommendations for Other Services       Precautions / Restrictions Precautions Precautions: Fall Restrictions Weight Bearing Restrictions: Yes RLE Weight Bearing: Touchdown weight bearing    Mobility  Bed Mobility Overal bed mobility: Needs Assistance Bed Mobility: Supine to Sit;Sit to Supine     Supine to sit: Supervision Sit to supine: Supervision   General bed mobility comments: Pt able to move out of bed and back to bed without assistance and increased time.    Transfers Overall transfer level: Needs assistance Equipment used:  (held to back of recliner to rise into standing.)   Sit to Stand: Mod assist;+2 physical assistance;From elevated surface (Pt standing on 4 inch stair on L side.  Bed in elevated position.)         General transfer comment: Pt able to rise into standing x 2 trials with L foot on 4 inch step and OT monitoring weight  bearing on R side.  Pt able to lift hips and maintain standing.  Hip extension is improved on 2nd trial.  Pt only able to stand for 5-10 seconds noted fatigue on L quad in standing.  Ambulation/Gait                 Stairs             Wheelchair Mobility    Modified Rankin (Stroke Patients Only)       Balance Overall balance assessment: Needs assistance Sitting-balance support: Feet supported;No upper extremity supported Sitting balance-Leahy Scale: Good       Standing balance-Leahy Scale: Poor Standing balance comment: heavy reliance on external assistance to rise into standing.                            Cognition Arousal/Alertness: Awake/alert Behavior During Therapy: WFL for tasks assessed/performed Overall Cognitive Status: Within Functional Limits for tasks assessed                                        Exercises      General Comments        Pertinent Vitals/Pain Pain Assessment: Faces Pain Score: 8  Pain Location: RLE, when in dependent position Pain Descriptors / Indicators: Grimacing;Guarding Pain Intervention(s): Monitored during  session;Repositioned;Patient requesting pain meds-RN notified    Home Living                      Prior Function            PT Goals (current goals can now be found in the care plan section) Acute Rehab PT Goals Patient Stated Goal: to go home Potential to Achieve Goals: Good Additional Goals Additional Goal #1: Pt will mobilize in w/c for 150' with supervision. Will utilize brakes for safe transfers without cues. Progress towards PT goals: Progressing toward goals    Frequency    Min 4X/week      PT Plan Current plan remains appropriate    Co-evaluation PT/OT/SLP Co-Evaluation/Treatment: Yes Reason for Co-Treatment: Complexity of the patient's impairments (multi-system involvement) PT goals addressed during session: Mobility/safety with mobility OT goals  addressed during session: ADL's and self-care      AM-PAC PT "6 Clicks" Mobility   Outcome Measure  Help needed turning from your back to your side while in a flat bed without using bedrails?: A Little Help needed moving from lying on your back to sitting on the side of a flat bed without using bedrails?: A Little Help needed moving to and from a bed to a chair (including a wheelchair)?: A Lot Help needed standing up from a chair using your arms (e.g., wheelchair or bedside chair)?: Total Help needed to walk in hospital room?: Total Help needed climbing 3-5 steps with a railing? : Total 6 Click Score: 11    End of Session Equipment Utilized During Treatment: Gait belt Activity Tolerance: Patient tolerated treatment well Patient left: with call bell/phone within reach;in bed;with bed alarm set Nurse Communication: Mobility status PT Visit Diagnosis: Other abnormalities of gait and mobility (R26.89);Muscle weakness (generalized) (M62.81);Pain Pain - Right/Left: Right Pain - part of body: Leg     Time: 5465-6812 PT Time Calculation (min) (ACUTE ONLY): 36 min  Charges:  $Therapeutic Activity: 8-22 mins                     Rebecca Kemp , PTA Acute Rehabilitation Services Pager [phone] Office [phone]    Rebecca Kemp 09/14/2020, 3:41 PM

## 2020-09-14 NOTE — Plan of Care (Signed)

## 2020-09-15 DIAGNOSIS — J9601 Acute respiratory failure with hypoxia: Secondary | ICD-10-CM

## 2020-09-15 LAB — GLUCOSE, CAPILLARY
Glucose-Capillary: 115 mg/dL — ABNORMAL HIGH (ref 70–99)
Glucose-Capillary: 157 mg/dL — ABNORMAL HIGH (ref 70–99)
Glucose-Capillary: 163 mg/dL — ABNORMAL HIGH (ref 70–99)
Glucose-Capillary: 183 mg/dL — ABNORMAL HIGH (ref 70–99)
Glucose-Capillary: 67 mg/dL — ABNORMAL LOW (ref 70–99)

## 2020-09-15 MED ORDER — OXYCODONE HCL 10 MG PO TABS: 10.0000 mg | ORAL_TABLET | ORAL | 0 refills | Status: DC | PRN

## 2020-09-15 MED ORDER — ENOXAPARIN SODIUM 100 MG/ML IJ SOSY: 100.0000 mg | PREFILLED_SYRINGE | INTRAMUSCULAR | 0 refills | Status: DC

## 2020-09-15 NOTE — Plan of Care (Signed)
  Problem: Health Behavior/Discharge Planning: Goal: Ability to manage health-related needs will improve Outcome: Progressing   Problem: Activity: Goal: Risk for activity intolerance will decrease Outcome: Adequate for Discharge   Problem: Pain Managment: Goal: General experience of comfort will improve Outcome: Adequate for Discharge

## 2020-09-15 NOTE — Care Management Important Message (Signed)
Important Message  Patient Details  Name: Rebecca Kemp MRN: [id number] Date of Birth: 01/07/1956   Medicare Important Message Given:  Yes     Oralia Rud Tyarra Nolton 09/15/2020, 1:43 PM

## 2020-09-15 NOTE — Progress Notes (Signed)
Physical Therapy Treatment Patient Details Name: Rebecca Kemp MRN: [id number] DOB: 04/14/1955 Today's Date: 09/15/2020    History of Present Illness Pt is a 65 y/o female admitted 6/28 after suffering a mechanical fall at the grocery store.  Pt s/p ORIF of right intra-articular distal femur fracture.  Pt initially with encephalopathy that is improving.  PMHx:  HTN, DM2.    PT Comments    Focused on standing trials this pm.  Pt to d/c home this pm with PTAR transport and support from her daughter and grandchildren.  Pt continues to struggle to stand and 4 inch step utilize maintenance of weight bearing.  Pt can function from WC level.     Follow Up Recommendations  CIR (pt refusing CIR due to cost qill require HHPT.)     Equipment Recommendations  Hospital bed;Other (comment);Rolling walker with 5" wheels;3in1 (PT) (bariatric drop arm commode, RW, WC, and hopsital bed.  hoyer lift, slide board and PTAR transport home.)    Recommendations for Other Services       Precautions / Restrictions Precautions Precautions: Fall Restrictions Weight Bearing Restrictions: Yes RLE Weight Bearing: Touchdown weight bearing    Mobility  Bed Mobility Overal bed mobility: Needs Assistance Bed Mobility: Rolling;Sidelying to Sit;Sit to Sidelying Rolling: Mod assist Sidelying to sit: Min guard   Sit to supine: Min guard   General bed mobility comments: Increased time and effort to move to edge of bed and back to bed.  Pt required cues for technique and assistance.    Transfers Overall transfer level: Needs assistance Equipment used:  (attempted RW but did not work as she was fearful to commit.  Utilized 3-musketeers support and able to stand x 1 on 2nd attempt on able to lift hips but could not stand erect.) Transfers: Sit to/from Stand Sit to Stand: Mod assist;+2 physical assistance         General transfer comment: Pt fatigue after rolling for clean up of BM in bed.  Performed x 2  standing trials this session with poor ability to come to full standing on last attempt.  Ambulation/Gait Ambulation/Gait assistance:  (unable.)               Stairs             Wheelchair Mobility    Modified Rankin (Stroke Patients Only)       Balance Overall balance assessment: Needs assistance Sitting-balance support: Feet supported;No upper extremity supported Sitting balance-Leahy Scale: Good       Standing balance-Leahy Scale: Poor Standing balance comment: heavy reliance on external assistance to rise into standing.                            Cognition Arousal/Alertness: Awake/alert Behavior During Therapy: WFL for tasks assessed/performed Overall Cognitive Status: Within Functional Limits for tasks assessed                                        Exercises      General Comments        Pertinent Vitals/Pain Pain Assessment: 0-10 Pain Score: 8  Pain Location: RLE, when in dependent position Pain Descriptors / Indicators: Grimacing;Guarding Pain Intervention(s): Monitored during session;Repositioned    Home Living  Prior Function            PT Goals (current goals can now be found in the care plan section) Acute Rehab PT Goals Patient Stated Goal: to go home Potential to Achieve Goals: Good Additional Goals Additional Goal #1: Pt will mobilize in w/c for 150' with supervision. Will utilize brakes for safe transfers without cues. Progress towards PT goals: Progressing toward goals    Frequency    Min 4X/week      PT Plan Current plan remains appropriate    Co-evaluation              AM-PAC PT "6 Clicks" Mobility   Outcome Measure  Help needed turning from your back to your side while in a flat bed without using bedrails?: A Little Help needed moving from lying on your back to sitting on the side of a flat bed without using bedrails?: A Little Help needed moving  to and from a bed to a chair (including a wheelchair)?: A Little Help needed standing up from a chair using your arms (e.g., wheelchair or bedside chair)?: Total Help needed to walk in hospital room?: Total Help needed climbing 3-5 steps with a railing? : Total 6 Click Score: 12    End of Session Equipment Utilized During Treatment: Gait belt Activity Tolerance: Patient tolerated treatment well Patient left: with call bell/phone within reach;in bed;with bed alarm set Nurse Communication: Mobility status PT Visit Diagnosis: Other abnormalities of gait and mobility (R26.89);Muscle weakness (generalized) (M62.81);Pain Pain - Right/Left: Right Pain - part of body: Leg     Time: 8119-1478 PT Time Calculation (min) (ACUTE ONLY): 30 min  Charges:  $Therapeutic Activity: 23-37 mins                     Bonney Leitz , PTA Acute Rehabilitation Services Pager [phone] Office [phone]    Li Fragoso Artis Delay 09/15/2020, 12:31 PM

## 2020-09-15 NOTE — TOC Progression Note (Addendum)
Transition of Care [facility]) - Progression Note    Patient Details  Name: Rebecca Kemp MRN: [id number] Date of Birth: 1956-02-08  Transition of [facility] CM/SW Contact  Bernie Fobes, Adria Devon, RN Phone Number: 09/15/2020, 11:11 AM  Clinical Narrative:    Spoke to patient's daughter Rebecca Kemp [phone] , all DME was delivered.   Wheel chair is " a little to wide to go through interior doors".   Spoke to [place] with [employer]. They had to provide that size wheel chair due to patient's ht and weight, however if HHPT does an assessment and recommends one size smaller [employer] can exchange at that point. Patient voiced understanding.   Spoke to [place] with [facility], she is aware patient is discharging today , and will need discharge summary faxed to her at [phone].  Patient confirmed address ( [address] [place], [place] [address])[facility] patient and Rebecca Kemp , they are requesting PTAR transport home. PTAR called papers in chart.   NCM secured chatted MD and spoke to bedside nurse   Discharge summary faxed to [facility]  Expected Discharge Plan: Home w Home Health Services Barriers to Discharge: Continued Medical Work up  Expected Discharge Plan and Services Expected Discharge Plan: Home w Home Health Services   Discharge Planning Services: CM Consult Post Acute Care Choice: Home Health, Durable Medical Equipment Living arrangements for the past 2 months: Mobile Home                 DME Arranged: 3-N-1, Walker rolling, Hospital bed, Wheelchair manual DME Agency: [facility] Date DME Agency Contacted: 09/08/20 Time DME Agency Contacted: 1242 Representative spoke with at DME Agency: Velna Hatchet HH Arranged: PT, OT, Nurse's Aide HH Agency: [facility] Date [facility] Agency Contacted: 09/08/20 Time HH Agency Contacted: 1242 Representative spoke with at [facility] Agency: via Patton Salles.   Social Determinants of Health (SDOH) Interventions    Readmission Risk  Interventions No flowsheet data found.

## 2020-09-15 NOTE — Progress Notes (Signed)
Pt given discharge instructions and gone over with her. She verbalized understanding. Pt able to demonstrate Lovenox injection technique. All belongings gathered to be sent with her. Pt home via [facility]

## 2020-09-15 NOTE — Discharge Summary (Signed)
Physician Discharge Summary  Rebecca Kemp ZOX:[id number] DOB: 12/14/1955 DOA: 09/01/2020  PCP: Pcp, No  Admit date: 09/01/2020 Discharge date: 09/15/2020  Admitted From: home Disposition:  home  Recommendations for Outpatient Follow-up:  Follow up with PCP in 1-2 weeks Follow-up with orthopedic surgery as scheduled as scheduled  Home Health: PT, OT Equipment/Devices: none  Discharge Condition: stable CODE STATUS: Full code Diet recommendation: heart healthy  HPI: Per admitting MD, Rebecca Kemp is a 66 y.o. female with medical history significant of hypertension, asthma, type 2 diabetes, GERD presented to [facility] ED via EMS for evaluation of right leg injury and inability to ambulate after a mechanical fall at a grocery store.  Hypertensive on arrival to the ED, remainder of vital signs stable.  Labs showing WBC 8.0, hemoglobin 13.7, hematocrit 40.6, platelet count 266K.  Sodium 141, potassium 4.4, chloride 105, bicarb 31, BUN 15, creatinine 0.8, glucose 141.  X-ray showing comminuted, impacted, displaced fracture of the distal femur extending to the knee joint.  CT showing acute distal femur fracture with appearance most compatible with C3.1 fracture under the AO classification system and also showing hemorrhagic prepatellar bursitis.  Patient was seen by orthopedics at [facility] who recommended transfer to a tertiary care center for trauma specialist.  Patient was placed in a long leg splint with 45 degrees of knee flexion.  ED physician spoke to Dr. Eulah Pont with orthopedics at [facility] who agreed to see the patient in consultation.  Patient had also complained of left rib pain, left hand pain, and right hip pain.  X-rays of left ribs and chest, left hand, and right hip/pelvis negative for acute finding.  Per signout from accepting physician Dr. Sheppard Penton, ED had also reported altered mental status and elevated CO2 levels for which patient was started on BiPAP. Patient is  confused and somnolent.  Very slow to respond to questions.  States she was pushing her grocery cart and somehow fell.  She thinks she hit her head.  Complaining of pain in her right leg.  No additional history could be obtained from her.  Hospital Course / Discharge diagnoses: Principal Problem Right femur fracture following fall-orthopedic surgery consulted and followed patient while hospitalized.  She is status post ORIF on 09/03/2020.  Physical therapy recommended SNF however given that there is a co-pay patient does not want to go there.  Recommendations have been changed to CIR however patient refusing and wants to go home.  Home health has been maximized upon discharge and patient has a bariatric bed at home, wheelchair, sliding board.  She will be prescribed pain medications as well as Lovenox for 14 days per orthopedic surgery recommendations for DVT prophylaxis.  She will be discharged home in stable condition with outpatient follow-up.   Active Problems Acute metabolic encephalopathy, acute hypercarbic respiratory failure-happened on admission, likely in the setting of narcotic use, hypercarbia given elevated PCO2.  Resolved, she is back to baseline upon discharge.  She has a history of OSA, has been on CPAP about 10 years ago but currently not using it.  She will need a sleep study as an outpatient CKD 3b- currently at baseline Anemia of chronic disease, acute blood loss anemia-hemoglobin stable. Type 2 diabetes mellitus, diabetic neuropathy-A1c 6.8, continue home regimen Obesity, class III-BMI 60, she would benefit from weight loss  Sepsis ruled out   Discharge Instructions   Allergies as of 09/15/2020       Reactions   Iodinated Diagnostic Agents Nausea And  Vomiting, Rash   Metformin Diarrhea   Morphine Itching, Rash        Medication List     TAKE these medications    dicyclomine 10 MG capsule Commonly known as: BENTYL Take 10 mg by mouth 3 (three) times daily.    enoxaparin 100 MG/ML injection Commonly known as: LOVENOX Inject 1 mL (100 mg total) into the skin daily for 14 days. Start taking on: September 16, 2020   escitalopram 20 MG tablet Commonly known as: LEXAPRO Take 20 mg by mouth daily.   gabapentin 800 MG tablet Commonly known as: NEURONTIN Take 800 mg by mouth 3 (three) times daily.   montelukast 10 MG tablet Commonly known as: SINGULAIR Take 10 mg by mouth daily.   olmesartan-hydrochlorothiazide 40-25 MG tablet Commonly known as: BENICAR HCT Take 1 tablet by mouth daily.   Oxycodone HCl 10 MG Tabs Take 1 tablet (10 mg total) by mouth every 4 (four) hours as needed for severe pain or moderate pain. What changed:  medication strength how much to take when to take this reasons to take this   pantoprazole 40 MG tablet Commonly known as: PROTONIX Take 40 mg by mouth daily.   promethazine 25 MG tablet Commonly known as: PHENERGAN Take 25 mg by mouth every 6 (six) hours as needed for nausea/vomiting.   tiZANidine 4 MG tablet Commonly known as: ZANAFLEX Take 4 mg by mouth every 8 (eight) hours as needed for muscle spasms.   Evaristo Bury FlexTouch 200 UNIT/ML FlexTouch Pen Generic drug: insulin degludec Inject into the skin.   Ventolin HFA 108 (90 Base) MCG/ACT inhaler Generic drug: albuterol Inhale 2 puffs into the lungs every 4 (four) hours as needed for shortness of breath or wheezing.   Victoza 18 MG/3ML Sopn Generic drug: liraglutide Inject into the skin.   Vitamin D3 25 MCG tablet Commonly known as: Vitamin D Take 2 tablets (2,000 Units total) by mouth daily.               Durable Medical Equipment  (From admission, onward)           Start     Ordered   09/14/20 1535  For home use only DME Hospital bed  Once       Comments: Bariatric, weight 196 KG, body habitus  Question Answer Comment  Length of Need 6 Months   Patient has (list medical condition): femur fracture   The above medical condition  requires: Patient requires the ability to reposition frequently   Head must be elevated greater than: 30 degrees   Bed type Semi-electric   Hoyer Lift Yes   Support Surface: Gel Overlay      09/14/20 1534   09/14/20 1534  For home use only DME standard manual wheelchair with seat cushion  Once       Comments: Patient suffers from fracture which impairs their ability to perform daily activities like bathing, and walking in the home.  A walker will not resolve issue with performing activities of daily living. A wheelchair will allow patient to safely perform daily activities. Patient can safely propel the wheelchair in the home or has a caregiver who can provide assistance. Length of need 6 months . Accessories: elevating leg rests (ELRs), wheel locks, extensions and anti-tippers. Bariatric due to weight of 196KG, and body habitus   09/14/20 1533   09/14/20 1533  For home use only DME 3 n 1  Once       Comments: Bariatric, weight  196 KG, body habitus   09/14/20 1533   09/14/20 1532  For home use only DME Walker rolling  Once       Comments: Bariatric, weight 196 KG, body habitus  Question Answer Comment  Walker: With 5 Inch Wheels   Patient needs a walker to treat with the following condition Weakness      09/14/20 1531   09/10/20 1355  For home use only DME Other see comment  Once       Comments: HOYER LIFT  Question:  Length of Need  Answer:  Lifetime   09/10/20 1354   09/10/20 1355  For home use only DME Other see comment  Once       Comments: Transfer board  Question:  Length of Need  Answer:  Lifetime   09/10/20 1355   09/08/20 1232  For home use only DME Walker rolling  Once       Comments: Bariatric, weight 196 KG, body habitus  Question Answer Comment  Walker: With 5 Inch Wheels   Patient needs a walker to treat with the following condition Weakness      09/08/20 1232   09/08/20 1218  For home use only DME 3 n 1  Once       Comments: Bariatric needed for weight of 196KG  and body habitus   09/08/20 1232            Follow-up Information     [facility] Follow up.   Specialty: Home Health Services Contact information: [address] [place] [address] [phone]         [facility] Follow up.   Why: Hospital bed, hoyer lift, wheelchair, Rolling walker, and slide board arranged- to be delivered to the home Contact information: [address] [place] [address] [phone]                 Consultations: Orthopedic surgery  Procedures/Studies:  DG Abd 1 View  Result Date: 09/05/2020 CLINICAL DATA:  Follow-up RIGHT LOWER QUADRANT abdominal pain. EXAM: ABDOMEN - 1 VIEW COMPARISON:  06/02/2020 FINDINGS: Bowel gas pattern is nonobstructive. Surgical clips are present in the RIGHT UPPER QUADRANT of the abdomen. Known LEFT LOWER QUADRANT hernia is not well seen. IMPRESSION: Nonobstructive bowel gas pattern. Electronically Signed   By: Norva Pavlov M.D.   On: 09/05/2020 10:57   CT HEAD WO CONTRAST  Result Date: 09/02/2020 CLINICAL DATA:  65 year old female with altered mental status and fall. EXAM: CT HEAD WITHOUT CONTRAST TECHNIQUE: Contiguous axial images were obtained from the base of the skull through the vertex without intravenous contrast. COMPARISON:  [facility] Brain MRI 04/01/2010. Head CT 05/23/2014. FINDINGS: Brain: Cerebral volume is not significantly changed since 2016. No midline shift, ventriculomegaly, mass effect, evidence of mass lesion, intracranial hemorrhage or evidence of cortically based acute infarction. Hypodensity in the bilateral anterior limb white matter internal capsules has progressed since 2016. Mild associated heterogeneity in the left thalamus is new. But mild for age subcortical white matter hypodensity elsewhere. No cortical encephalomalacia identified. Small dystrophic calcification left lentiform. Vascular: No suspicious intracranial vascular  hyperdensity. Skull: Intact.  No acute osseous abnormality identified. Sinuses/Orbits: Trace fluid in the hyperplastic left sphenoid sinus is chronic. Other visualized paranasal sinuses and mastoids are clear. Other: No orbit or scalp soft tissue injury identified. IMPRESSION: 1. No acute intracranial abnormality or acute traumatic injury identified. 2. Progression of small vessel disease changes in the deep white  matter capsules since 2016. Electronically Signed   By: Odessa FlemingH  Hall M.D.   On: 09/02/2020 04:32   DG CHEST PORT 1 VIEW  Result Date: 09/05/2020 CLINICAL DATA:  Follow-up. RIGHT LOWER QUADRANT abdominal pain. History of asthma. EXAM: PORTABLE CHEST 1 VIEW COMPARISON:  08/31/2020 FINDINGS: Stable cardiomegaly. Lungs are free of focal consolidations and pleural effusions. There is no pulmonary edema. IMPRESSION: Stable cardiomegaly. Electronically Signed   By: Norva PavlovElizabeth  Brown M.D.   On: 09/05/2020 10:55   DG C-Arm 1-60 Min  Result Date: 09/03/2020 CLINICAL DATA:  Surgery, elective. Additional history provided: Open reduction internal fixation (ORIF) distal femur fracture (right). Provided fluoroscopy time 1 minutes, 20 seconds (10.86 mGy). EXAM: RIGHT FEMUR 2 VIEWS; DG C-ARM 1-60 MIN COMPARISON:  CT of the right lower extremity 08/31/2020. FINDINGS: Five intraoperative fluoroscopic images of the right femur are submitted. On the provided images, there are findings of interval ORIF of a known comminuted distal right femoral fracture utilizing a lateral plate and multiple interlocking screws. Persistent although improved displacement at the fracture sites. IMPRESSION: Five intraoperative fluoroscopic images of the right femur from right femoral ORIF, as described. Electronically Signed   By: Jackey LogeKyle  Golden DO   On: 09/03/2020 16:57   DG FEMUR, MIN 2 VIEWS RIGHT  Result Date: 09/03/2020 CLINICAL DATA:  Surgery, elective. Additional history provided: Open reduction internal fixation (ORIF) distal femur  fracture (right). Provided fluoroscopy time 1 minutes, 20 seconds (10.86 mGy). EXAM: RIGHT FEMUR 2 VIEWS; DG C-ARM 1-60 MIN COMPARISON:  CT of the right lower extremity 08/31/2020. FINDINGS: Five intraoperative fluoroscopic images of the right femur are submitted. On the provided images, there are findings of interval ORIF of a known comminuted distal right femoral fracture utilizing a lateral plate and multiple interlocking screws. Persistent although improved displacement at the fracture sites. IMPRESSION: Five intraoperative fluoroscopic images of the right femur from right femoral ORIF, as described. Electronically Signed   By: Jackey LogeKyle  Golden DO   On: 09/03/2020 16:57   DG FEMUR PORT, MIN 2 VIEWS RIGHT  Result Date: 09/03/2020 CLINICAL DATA:  Post ORIF, right femur fracture EXAM: RIGHT FEMUR PORTABLE 2 VIEW COMPARISON:  CT 08/31/2020 intraoperative fluoroscopy 09/03/2020 FINDINGS: Patient is post ORIF of the comminuted distal femoral fracture with intra-articular extension. Improved alignment post placement of a lateral plate and screw fixation construct. Some residual cortical step-off noted along the medial femoral metadiaphysis. Excreted postsurgical soft tissue changes including diffuse soft tissue swelling, soft tissue gas and pneumarthrosis. No acute hardware complication is evident. No unexpected retained foreign bodies. IMPRESSION: Post ORIF of a comminuted distal femoral fracture with intra-articular extension. Alignment as above. No acute complication or unexpected retained foreign bodies. See operative report for further details. Electronically Signed   By: Kreg ShropshirePrice  DeHay M.D.   On: 09/03/2020 19:56     Subjective: - no chest pain, shortness of breath, no abdominal pain, nausea or vomiting.   Discharge Exam: BP (!) 109/56 (BP Location: Left Arm)   Pulse 91   Temp 98 F (36.7 C) (Oral)   Resp 17   Ht 5\' 11"  (1.803 m)   Wt (!) 196 kg   SpO2 97%   BMI 60.25 kg/m   General: Pt is alert,  awake, not in acute distress Cardiovascular: RRR, S1/S2 +, no rubs, no gallops Respiratory: CTA bilaterally, no wheezing, no rhonchi Abdominal: Soft, NT, ND, bowel sounds + Extremities: no edema, no cyanosis   The results of significant diagnostics from this hospitalization (including imaging, microbiology,  ancillary and laboratory) are listed below for reference.     Microbiology: No results found for this or any previous visit (from the past 240 hour(s)).   Labs: Basic Metabolic Panel: Recent Labs  Lab 09/10/20 0611 09/13/20 0846  NA  --  137  K  --  4.0  CL  --  100  CO2  --  30  GLUCOSE  --  211*  BUN  --  24*  CREATININE 1.26* 1.13*  CALCIUM  --  9.8   Liver Function Tests: No results for input(s): AST, ALT, ALKPHOS, BILITOT, PROT, ALBUMIN in the last 168 hours. CBC: No results for input(s): WBC, NEUTROABS, HGB, HCT, MCV, PLT in the last 168 hours. CBG: Recent Labs  Lab 09/14/20 2109 09/15/20 0001 09/15/20 0401 09/15/20 0558 09/15/20 0818  GLUCAP 219* 163* 67* 115* 183*   Hgb A1c No results for input(s): HGBA1C in the last 72 hours. Lipid Profile No results for input(s): CHOL, HDL, LDLCALC, TRIG, CHOLHDL, LDLDIRECT in the last 72 hours. Thyroid function studies No results for input(s): TSH, T4TOTAL, T3FREE, THYROIDAB in the last 72 hours.  Invalid input(s): FREET3 Urinalysis No results found for: COLORURINE, APPEARANCEUR, LABSPEC, PHURINE, GLUCOSEU, HGBUR, BILIRUBINUR, KETONESUR, PROTEINUR, UROBILINOGEN, NITRITE, LEUKOCYTESUR  FURTHER DISCHARGE INSTRUCTIONS:   Get Medicines reviewed and adjusted: Please take all your medications with you for your next visit with your Primary MD   Laboratory/radiological data: Please request your Primary MD to go over all hospital tests and procedure/radiological results at the follow up, please ask your Primary MD to get all Hospital records sent to his/her office.   In some cases, they will be blood work, cultures  and biopsy results pending at the time of your discharge. Please request that your primary care M.D. goes through all the records of your hospital data and follows up on these results.   Also Note the following: If you experience worsening of your admission symptoms, develop shortness of breath, life threatening emergency, suicidal or homicidal thoughts you must seek medical attention immediately by calling 911 or calling your MD immediately  if symptoms less severe.   You must read complete instructions/literature along with all the possible adverse reactions/side effects for all the Medicines you take and that have been prescribed to you. Take any new Medicines after you have completely understood and accpet all the possible adverse reactions/side effects.    Do not drive when taking Pain medications or sleeping medications (Benzodaizepines)   Do not take more than prescribed Pain, Sleep and Anxiety Medications. It is not advisable to combine anxiety,sleep and pain medications without talking with your primary care practitioner   Special Instructions: If you have smoked or chewed Tobacco  in the last 2 yrs please stop smoking, stop any regular Alcohol  and or any Recreational drug use.   Wear Seat belts while driving.   Please note: You were cared for by a hospitalist during your hospital stay. Once you are discharged, your primary care physician will handle any further medical issues. Please note that NO REFILLS for any discharge medications will be authorized once you are discharged, as it is imperative that you return to your primary care physician (or establish a relationship with a primary care physician if you do not have one) for your post hospital discharge needs so that they can reassess your need for medications and monitor your lab values.  Time coordinating discharge: 40 minutes  SIGNED:  Pamella Pert, MD, PhD 09/15/2020, 11:14 AM

## 2020-09-15 NOTE — Progress Notes (Addendum)
Inpatient Diabetes Program Recommendations  AACE/ADA: New Consensus Statement on Inpatient Glycemic Control (2015)  Target Ranges:  Prepandial:   less than 140 mg/dL      Peak postprandial:   less than 180 mg/dL (1-2 hours)      Critically ill patients:  140 - 180 mg/dL   Lab Results  Component Value Date   GLUCAP 183 (H) 09/15/2020   HGBA1C 6.8 (H) 09/02/2020    Review of Glycemic Control Results for Rebecca Kemp, Rebecca Kemp (MRN [id number]) as of 09/15/2020 10:47  Ref. Range 09/14/2020 04:03 09/14/2020 07:22 09/14/2020 11:58 09/14/2020 16:31 09/14/2020 21:09 09/15/2020 00:01 09/15/2020 04:01 09/15/2020 05:58 09/15/2020 08:18  Glucose-Capillary Latest Ref Range: 70 - 99 mg/dL 425 (H) 956 (H) 387 (H) 161 (H) 219 (H) 163 (H) 67 (L) 115 (H) 183 (H)   Diabetes history: DM 2 Outpatient Diabetes medications: Diet controlled, on Tresiba and Victoza at one time Current orders for Inpatient glycemic control:  Novolog 0-9 units Q4 hours  Inpatient Diabetes Program Recommendations:    Hypoglycemia on Q4 hour correction Diet ordered consider  -Changing Novolog Correction to tid + hs scale  Thanks,  Christena Deem RN, MSN, BC-ADM Inpatient Diabetes Coordinator Team Pager [phone] (8a-5p)

## 2021-06-08 ENCOUNTER — Ambulatory Visit: Payer: Self-pay | Admitting: Student

## 2021-06-08 DIAGNOSIS — S72451K Displaced supracondylar fracture without intracondylar extension of lower end of right femur, subsequent encounter for closed fracture with nonunion: Secondary | ICD-10-CM

## 2021-06-21 NOTE — Progress Notes (Addendum)
Surgical Instructions ? ? ? Your procedure is scheduled on June 30, 2021. ? Report to [facility] Main Entrance "A" at 5:30 A.M., then check in with the Admitting office. ? Call this number if you have problems the morning of surgery: ? [phone] ? ? If you have any questions prior to your surgery date call [phone]: Open Monday-Friday 8am-4pm ? ? ? Remember: ? Do not eat or drink after midnight the night before your surgery ?  ? Take these medicines the morning of surgery with A SIP OF WATER:  ?Amlodipine (Norvasc) ?Escitalopram (Lexapro) ?Gabapentin (Neurontin) ?Dicyclomine (Bentyl) ?Hydralazine (Apresoline) ?Montelukast (Singulair) ?Pantoprazole (Protonix) ? ?If needed: ?Oxycodone ?Promethazine (Penergan) ?Tizanidine (Zanaflex) ?Ventolin HFA--Please bring all inhalers with you the day of surgery.  ? ? ?WHAT DO I DO ABOUT MY DIABETES MEDICATION? ? ? ?The day of surgery, DO NOT take Victoza (liraglutide) ? ?THE MORNING OF SURGERY: take a half dose, 24 units of [place]. ? ? ?HOW TO MANAGE YOUR DIABETES ?BEFORE AND AFTER SURGERY ? ?Why is it important to control my blood sugar before and after surgery? ?Improving blood sugar levels before and after surgery helps healing and can limit problems. ?A way of improving blood sugar control is eating a healthy diet by: ? Eating less sugar and carbohydrates ? Increasing activity/exercise ? Talking with your doctor about reaching your blood sugar goals ?High blood sugars (greater than 180 mg/dL) can raise your risk of infections and slow your recovery, so you will need to focus on controlling your diabetes during the weeks before surgery. ?Make sure that the doctor who takes care of your diabetes knows about your planned surgery including the date and location. ? ?How do I manage my blood sugar before surgery? ?Check your blood sugar at least 4 times a day, starting 2 days before surgery, to make sure that the level is not too high or low. ? ?Check your blood sugar the  morning of your surgery when you wake up and every 2 hours until you get to the Short Stay unit. ? ?If your blood sugar is less than 70 mg/dL, you will need to treat for low blood sugar: ?Do not take insulin. ?Treat a low blood sugar (less than 70 mg/dL) with ? cup of clear juice (cranberry or apple), 4 glucose tablets, OR glucose gel. ?Recheck blood sugar in 15 minutes after treatment (to make sure it is greater than 70 mg/dL). If your blood sugar is not greater than 70 mg/dL on recheck, call [phone] for further instructions. ?Report your blood sugar to the short stay nurse when you get to Short Stay. ? ?If you are admitted to the hospital after surgery: ?Your blood sugar will be checked by the staff and you will probably be given insulin after surgery (instead of oral diabetes medicines) to make sure you have good blood sugar levels. ?The goal for blood sugar control after surgery is 80-180 mg/dL. ? ? ?As of today, STOP taking any Aspirin (unless otherwise instructed by your surgeon) Aleve, Naproxen, Ibuprofen, Motrin, Advil, Goody's, BC's, all herbal medications, fish oil, and all vitamins. ? ?         ?Do not wear jewelry or makeup ?Do not wear lotions, powders, perfumes, or deodorant. ?Do not shave 48 hours prior to surgery.  ?Do not bring valuables to the hospital. ?Do not wear nail polish, gel polish, artificial nails, or any other type of covering on natural nails (fingers and toes) ?If you have artificial nails or gel coating  that need to be removed by a nail salon, please have this removed prior to surgery. Artificial nails or gel coating may interfere with anesthesia's ability to adequately monitor your vital signs. ? ?[facility] is not responsible for any belongings or valuables. .  ? ?Do NOT Smoke (Tobacco/Vaping)  24 hours prior to your procedure ? ?If you use a CPAP at night, you may bring your mask for your overnight stay. ?  ?Contacts, glasses, hearing aids, dentures or partials may not be  worn into surgery, please bring cases for these belongings ?  ?For patients admitted to the hospital, discharge time will be determined by your treatment team. ?  ?Patients discharged the day of surgery will not be allowed to drive home, and someone needs to stay with them for 24 hours. ? ? ?SURGICAL WAITING ROOM VISITATION ?Patients having surgery or a procedure in a hospital may have two support people. ?Children under the age of 60 must have an adult with them who is not the patient. ?They may stay in the waiting area during the procedure and may switch out with other visitors. If the patient needs to stay at the hospital during part of their recovery, the visitor guidelines for inpatient rooms apply. ? ?Please refer to the [facility] website for the visitor guidelines for Inpatients (after your surgery is over and you are in a regular room).  ? ? ? ? ? ?Special instructions:   ? ?Oral Hygiene is also important to reduce your risk of infection.  Remember - BRUSH YOUR TEETH THE MORNING OF SURGERY WITH YOUR REGULAR TOOTHPASTE ? ? ?- Preparing For Surgery ? ?Before surgery, you can play an important role. Because skin is not sterile, your skin needs to be as free of germs as possible. You can reduce the number of germs on your skin by washing with CHG (chlorahexidine gluconate) Soap before surgery.  CHG is an antiseptic cleaner which kills germs and bonds with the skin to continue killing germs even after washing.   ? ? ?Please do not use if you have an allergy to CHG or antibacterial soaps. If your skin becomes reddened/irritated stop using the CHG.  ?Do not shave (including legs and underarms) for at least 48 hours prior to first CHG shower. It is OK to shave your face. ? ?Please follow these instructions carefully. ?  ? ? Shower the NIGHT BEFORE SURGERY and the MORNING OF SURGERY with CHG Soap.  ? If you chose to wash your hair, wash your hair first as usual with your normal shampoo. After you shampoo,  rinse your hair and body thoroughly to remove the shampoo.  Then [employer] and genitals (private parts) with your normal soap and rinse thoroughly to remove soap. ? ?After that Use CHG Soap as you would any other liquid soap. You can apply CHG directly to the skin and wash gently with a scrungie or a clean washcloth.  ? ?Apply the CHG Soap to your body ONLY FROM THE NECK DOWN.  Do not use on open wounds or open sores. Avoid contact with your eyes, ears, mouth and genitals (private parts). Wash Face and genitals (private parts)  with your normal soap.  ? ?Wash thoroughly, paying special attention to the area where your surgery will be performed. ? ?Thoroughly rinse your body with warm water from the neck down. ? ?DO NOT shower/wash with your normal soap after using and rinsing off the CHG Soap. ? ?Pat yourself dry with a CLEAN  TOWEL. ? ?Wear CLEAN PAJAMAS to bed the night before surgery ? ?Place CLEAN SHEETS on your bed the night before your surgery ? ?DO NOT SLEEP WITH PETS. ? ? ?Day of Surgery: ? ?Take a shower with CHG soap. ?Wear Clean/Comfortable clothing the morning of surgery ?Do not apply any deodorants/lotions.   ?Remember to brush your teeth WITH YOUR REGULAR TOOTHPASTE. ? ? ? ?If you received a COVID test during your pre-op visit it is requested that you wear a mask when out in public, stay away from anyone that may not be feeling well and notify your surgeon if you develop symptoms. If you have been in contact with anyone that has tested positive in the last 10 days please notify your surgeon. ? ?  ?Please read over the following fact sheets that you were given.  ? ?

## 2021-06-22 ENCOUNTER — Encounter (HOSPITAL_COMMUNITY): Payer: Self-pay

## 2021-06-22 ENCOUNTER — Other Ambulatory Visit: Payer: Self-pay

## 2021-06-22 ENCOUNTER — Encounter (HOSPITAL_COMMUNITY)
Admission: RE | Admit: 2021-06-22 | Discharge: 2021-06-22 | Disposition: A | Discharge status: Home / Self Care | Payer: Medicare HMO | Source: Ambulatory Visit | Attending: Student | Admitting: Student

## 2021-06-22 VITALS — BP 175/89 | HR 78 | Temp 98.0°F | Resp 17 | Ht 71.0 in | Wt 373.3 lb

## 2021-06-22 DIAGNOSIS — S72451K Displaced supracondylar fracture without intracondylar extension of lower end of right femur, subsequent encounter for closed fracture with nonunion: Secondary | ICD-10-CM

## 2021-06-22 DIAGNOSIS — G4733 Obstructive sleep apnea (adult) (pediatric): Secondary | ICD-10-CM | POA: Insufficient documentation

## 2021-06-22 DIAGNOSIS — S72451A Displaced supracondylar fracture without intracondylar extension of lower end of right femur, initial encounter for closed fracture: Secondary | ICD-10-CM | POA: Diagnosis not present

## 2021-06-22 DIAGNOSIS — Z01818 Encounter for other preprocedural examination: Secondary | ICD-10-CM | POA: Insufficient documentation

## 2021-06-22 DIAGNOSIS — X58XXXA Exposure to other specified factors, initial encounter: Secondary | ICD-10-CM | POA: Diagnosis not present

## 2021-06-22 DIAGNOSIS — I2510 Atherosclerotic heart disease of native coronary artery without angina pectoris: Secondary | ICD-10-CM

## 2021-06-22 DIAGNOSIS — R011 Cardiac murmur, unspecified: Secondary | ICD-10-CM | POA: Insufficient documentation

## 2021-06-22 DIAGNOSIS — E119 Type 2 diabetes mellitus without complications: Secondary | ICD-10-CM | POA: Diagnosis not present

## 2021-06-22 HISTORY — DX: Sleep apnea, unspecified: G47.30

## 2021-06-22 HISTORY — DX: Anxiety disorder, unspecified: F41.9

## 2021-06-22 HISTORY — DX: Gastro-esophageal reflux disease without esophagitis: K21.9

## 2021-06-22 HISTORY — DX: Polyneuropathy, unspecified: G62.9

## 2021-06-22 LAB — BASIC METABOLIC PANEL
Anion gap: 10 (ref 5–15)
BUN: 18 mg/dL (ref 8–23)
CO2: 25 mmol/L (ref 22–32)
Calcium: 9.7 mg/dL (ref 8.9–10.3)
Chloride: 105 mmol/L (ref 98–111)
Creatinine, Ser: 1.27 mg/dL — ABNORMAL HIGH (ref 0.44–1.00)
GFR, Estimated: 47 mL/min — ABNORMAL LOW (ref >60)
Glucose, Bld: 99 mg/dL (ref 70–99)
Potassium: 4.2 mmol/L (ref 3.5–5.1)
Sodium: 140 mmol/L (ref 135–145)

## 2021-06-22 LAB — CBC
HCT: 39.9 % (ref 36.0–46.0)
Hemoglobin: 12.6 g/dL (ref 12.0–15.0)
MCH: 29.4 pg (ref 26.0–34.0)
MCHC: 31.6 g/dL (ref 30.0–36.0)
MCV: 93 fL (ref 80.0–100.0)
Platelets: 232 10*3/uL (ref 150–400)
RBC: 4.29 MIL/uL (ref 3.87–5.11)
RDW: 14.1 % (ref 11.5–15.5)
WBC: 7.1 10*3/uL (ref 4.0–10.5)
nRBC: 0 % (ref 0.0–0.2)

## 2021-06-22 LAB — VITAMIN D 25 HYDROXY (VIT D DEFICIENCY, FRACTURES): Vit D, 25-Hydroxy: 28.2 ng/mL — ABNORMAL LOW (ref 30–100)

## 2021-06-22 LAB — SEDIMENTATION RATE: Sed Rate: 41 mm/hr — ABNORMAL HIGH (ref 0–22)

## 2021-06-22 LAB — GLUCOSE, CAPILLARY: Glucose-Capillary: 115 mg/dL — ABNORMAL HIGH (ref 70–99)

## 2021-06-22 LAB — HEMOGLOBIN A1C
Hgb A1c MFr Bld: 5.9 % — ABNORMAL HIGH (ref 4.8–5.6)
Mean Plasma Glucose: 122.63 mg/dL

## 2021-06-22 LAB — C-REACTIVE PROTEIN: CRP: 1.9 mg/dL — ABNORMAL HIGH (ref <1.0)

## 2021-06-22 NOTE — Progress Notes (Addendum)
PCP - Dr. Keturah Barre ?Cardiologist - Dr. Vernona Rieger, has not seen since 2021? ? ?PPM/ICD - n/a ? ?Chest x-ray - n/a ?EKG - 06/22/21 ?Stress Test - 05/08/19-CE ?ECHO - 05/07/19-CE ?Cardiac Cath - 20+ years ago. Pt reported it to be normal.  ? ?Sleep Study - OSA+ ?CPAP - pt states she can no tolerate CPAP so she does not use it.  ? ?Fasting Blood Sugar - 107-120 ?Checks Blood Sugar 1-2 times a day ?Last A1C: 6.4-02-10-21. Will collect A1C today as ordered.  ?CBG at PAT 115 ? ?Blood Thinner Instructions: n/a ?Aspirin Instructions: n/a ? ?NPO ? ?COVID TEST- n/a ? ? ?Anesthesia review: Yes, heart history and EKG review. ?Pt's BP at appointment 175/89. Pt states that she has not been taking BP meds as she should because her PCP recently made changes to them and she was not sure of the correct dosages. This RN called Dr. Landry Dyke office and spoke to a RN regarding her BP medications. Confirmed with RN each antihypertensive and diuretic medication, dosage, route, and frequency. Pt verbalized understanding and will adhere to regime starting today. Pt to call Dr. Landry Dyke office back if she has any other questions or concerns.  ? ?Patient denies shortness of breath, fever, cough and chest pain at PAT appointment ? ? ?All instructions explained to the patient, with a verbal understanding of the material. Patient agrees to go over the instructions while at home for a better understanding. Patient also instructed to self quarantine after being tested for COVID-19. The opportunity to ask questions was provided. ? ? ?

## 2021-06-23 NOTE — Progress Notes (Signed)
Anesthesia Chart Review: ? ?Patient's PCP previously referred patient to cardiology for evaluation of murmur and chest discomfort.  She was seen by Dr. Vernona Rieger on 05/01/2019.  At that time he ordered echocardiogram and nuclear stress test.  Echo showed normal LVEF, mild MR, mild LVH. Stress test was nonischemic. ? ?OSA, intolerant to CPAP. ? ?IDDM 2, well controlled, A1c 5.9 on preop labs. ? ?Preop labs reviewed, creatinine mildly elevated 1.27, otherwise unremarkable. ? ?EKG 06/22/21: Sinus rhythm with Premature atrial complexes. Rate 74. Right bundle branch block. RBBB also present on tracing 03/15/19 in care everywhere. ? ?TTE 05/07/2019 (Care Everywhere): ?SUMMARY  ?Mild left ventricular hypertrophy  ?Systolic function grossly normal with an estimated ejection fraction  ?of 55-60%.  ?There is mild mitral regurgitation.  ?No old study for comparison  ? ?Nuclear stress 05/07/19 (copy on chart): ?Abnormal exercise stress nuclear study but negative for ischemia ?2. Diaphragmatic attenuation inferior wall. ?3. Clinically negative stress test with no chest pain. ?4. The exercise tolerance was not assessed due to pharmacologic stress. ? ? ?Antionette Poles, PA-C ?[facility]/Anesthesiology ?Phone [phone] ?06/24/2021 1:18 PM ? ? ?

## 2021-06-24 NOTE — Anesthesia Preprocedure Evaluation (Addendum)
Anesthesia Evaluation  ?Patient identified by MRN, date of birth, ID band ?Patient awake ? ? ? ?Reviewed: ?Allergy & Precautions, NPO status , Patient's Chart, lab work & pertinent test results ? ?Airway ?Mallampati: III ? ?TM Distance: >3 FB ?Neck ROM: Full ? ? ? Dental ? ?(+) Dental Advisory Given ?  ?Pulmonary ?asthma , sleep apnea ,  ?  ?breath sounds clear to auscultation ? ? ? ? ? ? Cardiovascular ?hypertension, Pt. on medications ? ?Rhythm:Regular Rate:Normal ? ? ?  ?Neuro/Psych ?negative neurological ROS ?   ? GI/Hepatic ?Neg liver ROS, GERD  ,  ?Endo/Other  ?diabetes, Type 2 ? Renal/GU ?negative Renal ROS  ? ?  ?Musculoskeletal ? ? Abdominal ?  ?Peds ? Hematology ?negative hematology ROS ?(+)   ?Anesthesia Other Findings ? ? Reproductive/Obstetrics ? ?  ? ? ? ? ? ? ? ? ? ? ? ? ? ?  ?  ? ? ? ? ? ? ? ?Lab Results  ?Component Value Date  ? WBC 7.1 06/22/2021  ? HGB 12.6 06/22/2021  ? HCT 39.9 06/22/2021  ? MCV 93.0 06/22/2021  ? PLT 232 06/22/2021  ? ?Lab Results  ?Component Value Date  ? CREATININE 1.27 (H) 06/22/2021  ? BUN 18 06/22/2021  ? NA 140 06/22/2021  ? K 4.2 06/22/2021  ? CL 105 06/22/2021  ? CO2 25 06/22/2021  ? ? ?Anesthesia Physical ?Anesthesia Plan ? ?ASA: 3 ? ?Anesthesia Plan: General  ? ?Post-op Pain Management: Tylenol PO (pre-op)* and Ketamine IV*  ? ?Induction: Intravenous ? ?PONV Risk Score and Plan: 3 and Dexamethasone, Ondansetron, Midazolam and Treatment may vary due to age or medical condition ? ?Airway Management Planned: Oral ETT and Video Laryngoscope Planned ? ?Additional Equipment: None ? ?Intra-op Plan:  ? ?Post-operative Plan: Extubation in OR ? ?Informed Consent: I have reviewed the patients History and Physical, chart, labs and discussed the procedure including the risks, benefits and alternatives for the proposed anesthesia with the patient or authorized representative who has indicated his/her understanding and acceptance.  ? ? ? ?Dental  advisory given ? ?Plan Discussed with: CRNA ? ?Anesthesia Plan Comments: (PAT note by Antionette Poles, PA-C: ? ?Patient's PCP previously referred patient to cardiology for evaluation of murmur and chest discomfort.  She was seen by Dr. Vernona Rieger on 05/01/2019.  At that time he ordered echocardiogram and nuclear stress test.  Echo showed normal LVEF, mild MR, mild LVH. Stress test was nonischemic. ? ?OSA, intolerant to CPAP. ? ?IDDM 2, well controlled, A1c 5.9 on preop labs. ? ?Preop labs reviewed, creatinine mildly elevated 1.27, otherwise unremarkable. ? ?EKG 06/22/21: Sinus rhythm with Premature atrial complexes. Rate 74. Right bundle branch block. RBBB also present on tracing 03/15/19 in care everywhere. ? ?TTE 05/07/2019 (Care Everywhere): ?SUMMARY  ?Mild left ventricular hypertrophy  ?Systolic function grossly normal with an estimated ejection fraction  ?of 55-60%.  ?There is mild mitral regurgitation.  ?No old study for comparison  ? ?Nuclear stress 05/07/19 (copy on chart): ?1. Abnormal exercise stress nuclear study but negative for ischemia ?2. Diaphragmatic attenuation inferior wall. ?3. Clinically negative stress test with no chest pain. ?4. The exercise tolerance was not assessed due to pharmacologic stress. ? ? ?)  ? ? ? ? ? ?Anesthesia Quick Evaluation ? ?

## 2021-06-29 NOTE — H&P (Signed)
[facility] ([facility]) H&P ? ?Patient ID: ?Patches M Waddill ?MRN: [id number] ?DOB/AGE: [birth year] 66 y.o. ? ?Reason for Surgery: Right distal femur nonunion ? ?HPI: Rebecca Kemp is an 66 y.o. female with medical history significant of hypertension, asthma, type 2 diabetes, GERD presenting for surgery on right lower extremity. Patient underwent ORIF right distal femur fracture by Dr. Jena Gauss on 09/02/20 after sustaining a fall.  Patient is followed up at regular intervals with Dr. Jena Gauss in OTS clinic.  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  She was started on bone stimulator which is been unsuccessful in allowing the bone to fully heal.  She presents now for repair of the nonunion.  She continues to have significant pain in the leg which is limited her ability to perform daily activities. ? ?Last hemoglobin A1c 5.9 (06/22/2021).  Preoperative labs showed elevation of inflammatory markers.  Prior to initial surgery patient lived at home and did not use any assistive devices for ambulation.  Since June 2022, patient has been unable to return to baseline status for mobility. ? ?Past Medical History:  ?Diagnosis Date  ? Anxiety   ? Asthma   ? Complication of anesthesia   ? per daughter took 13 hours to wake up one time-pt reports this was because of too much IV pain medicine (fentanyl)  ? Diabetes (HCC) 09/02/2020  ? GERD (gastroesophageal reflux disease)   ? Hypertension   ? Neuropathy   ? Sleep apnea   ? ? ?Past Surgical History:  ?Procedure Laterality Date  ? ABDOMINAL HYSTERECTOMY    ? APPENDECTOMY    ? CESAREAN SECTION    ? x2  ? CHOLECYSTECTOMY    ? COLOSTOMY    ? COLOSTOMY TAKEDOWN    ? HERNIA REPAIR    ? with mesh-inguinal. Multiple surgeries.  ? ILEOSTOMY    ? ILEOSTOMY CLOSURE    ? ORIF FEMUR FRACTURE Right 09/03/2020  ? Procedure: OPEN REDUCTION INTERNAL FIXATION (ORIF) DISTAL FEMUR FRACTURE;  Surgeon: Roby Lofts, MD;  Location: [facility] OR;  Service: Orthopedics;  Laterality: Right;  ? ? ?No  family history on file. ? ?Social History:  reports that she has never smoked. She has never used smokeless tobacco. She reports that she does not drink alcohol and does not use drugs. ? ?Allergies:  ?Allergies  ?Allergen Reactions  ? Iodinated Contrast Media Nausea And Vomiting and Rash  ? Metformin Diarrhea  ? Morphine Itching and Rash  ? ? ?Medications: I have reviewed the patient's current medications. ?Prior to Admission:  ?No medications prior to admission.  ? ? ?ROS: Constitutional: No fever or chills ?Vision: No changes in vision ?ENT: No difficulty swallowing ?CV: No chest pain ?Pulm: No SOB or wheezing ?GI: No nausea or vomiting ?GU: No urgency or inability to hold urine ?Skin: No poor wound healing ?Neurologic: No numbness or tingling ?Psychiatric: No depression or anxiety ?Heme: No bruising ?Allergic: No reaction to medications or food ? ? ?Exam: ?There were no vitals taken for this visit. ?General: No acute distress ?Orientation: Alert and oriented x3 ?Mood and Affect: Mood and affect appropriate, pleasant and cooperative ?Gait: Severely limited due to fracture ?Coordination and balance: Within normal limits ? ?RLE: Healed surgical incision.  Tender with palpation over medial aspect of distal femur.  Notes some discomfort with motion of the knee.  Ankle dorsiflexion/plantarflexion intact.  No significant tenderness throughout the lower leg.  Is neurovascularly intact ? ?LLE: Skin without lesions. No tenderness to palpation. Full painless  ROM, full strength in each muscle groups without evidence of instability. ? ? ?Medical Decision Making: ?Data: ?Imaging: X-ray and CT scan right femur show comminuted distal femoral shaft fracture incomplete healing over the medial cortex. Lateral cortex appears fully healed. ? ?Labs: No results found for this or any previous visit (from the past 168 hour(s)). ? ? ?Assessment/Plan: ?66 year old female status post ORIF of right distal femur fracture 09/03/2020 ? ?Patient  has virtually gone on to nonunion.  She has failed conservative measures including bone stimulator.  I would recommend proceeding with repair of femur nonunion, which would include obtaining bone marrow  aspirate form the hip and bone grafting of the medial distal femur. Will plan to reinforce this area with a medial side plate. We will plan to admit patient overnight for pain control and therapies. Risks and benefits of the procedure were discussed with the patient. ? ? ?Gwinda Passe PA-C ?[facility] ?[phone] (office) ?[email or web address] ? ? ? ?

## 2021-06-30 ENCOUNTER — Other Ambulatory Visit: Payer: Self-pay

## 2021-06-30 ENCOUNTER — Inpatient Hospital Stay (HOSPITAL_COMMUNITY)
Admission: RE | Admit: 2021-06-30 | Discharge: 2021-07-04 | DRG: 481 | Disposition: A | Discharge status: Home Health | Payer: Medicare HMO | Attending: Student | Admitting: Student

## 2021-06-30 ENCOUNTER — Inpatient Hospital Stay (HOSPITAL_COMMUNITY): Payer: Medicare HMO | Admitting: Physician Assistant

## 2021-06-30 ENCOUNTER — Inpatient Hospital Stay (HOSPITAL_COMMUNITY): Payer: Medicare HMO

## 2021-06-30 ENCOUNTER — Encounter (HOSPITAL_COMMUNITY)
Admission: RE | Disposition: A | Discharge status: Home Health | Payer: Self-pay | Source: Home / Self Care | Attending: Student

## 2021-06-30 ENCOUNTER — Inpatient Hospital Stay (HOSPITAL_COMMUNITY): Payer: Medicare HMO | Admitting: Anesthesiology

## 2021-06-30 ENCOUNTER — Encounter (HOSPITAL_COMMUNITY): Payer: Self-pay | Admitting: Student

## 2021-06-30 DIAGNOSIS — S72401K Unspecified fracture of lower end of right femur, subsequent encounter for closed fracture with nonunion: Secondary | ICD-10-CM

## 2021-06-30 DIAGNOSIS — Z888 Allergy status to other drugs, medicaments and biological substances status: Secondary | ICD-10-CM

## 2021-06-30 DIAGNOSIS — Z91041 Radiographic dye allergy status: Secondary | ICD-10-CM

## 2021-06-30 DIAGNOSIS — Z23 Encounter for immunization: Secondary | ICD-10-CM | POA: Diagnosis present

## 2021-06-30 DIAGNOSIS — S72451K Displaced supracondylar fracture without intracondylar extension of lower end of right femur, subsequent encounter for closed fracture with nonunion: Principal | ICD-10-CM | POA: Diagnosis present

## 2021-06-30 DIAGNOSIS — E1140 Type 2 diabetes mellitus with diabetic neuropathy, unspecified: Secondary | ICD-10-CM | POA: Diagnosis present

## 2021-06-30 DIAGNOSIS — E119 Type 2 diabetes mellitus without complications: Secondary | ICD-10-CM | POA: Diagnosis not present

## 2021-06-30 DIAGNOSIS — I10 Essential (primary) hypertension: Secondary | ICD-10-CM

## 2021-06-30 DIAGNOSIS — Z885 Allergy status to narcotic agent status: Secondary | ICD-10-CM | POA: Diagnosis not present

## 2021-06-30 DIAGNOSIS — W19XXXD Unspecified fall, subsequent encounter: Secondary | ICD-10-CM | POA: Diagnosis present

## 2021-06-30 DIAGNOSIS — I959 Hypotension, unspecified: Secondary | ICD-10-CM | POA: Diagnosis not present

## 2021-06-30 DIAGNOSIS — D62 Acute posthemorrhagic anemia: Secondary | ICD-10-CM | POA: Diagnosis not present

## 2021-06-30 DIAGNOSIS — G4733 Obstructive sleep apnea (adult) (pediatric): Secondary | ICD-10-CM | POA: Diagnosis present

## 2021-06-30 DIAGNOSIS — G4730 Sleep apnea, unspecified: Secondary | ICD-10-CM | POA: Diagnosis not present

## 2021-06-30 DIAGNOSIS — K219 Gastro-esophageal reflux disease without esophagitis: Secondary | ICD-10-CM | POA: Diagnosis present

## 2021-06-30 DIAGNOSIS — J45909 Unspecified asthma, uncomplicated: Secondary | ICD-10-CM | POA: Diagnosis present

## 2021-06-30 HISTORY — PX: ORIF FEMUR FRACTURE: SHX2119

## 2021-06-30 LAB — CBC
HCT: 41.5 % (ref 36.0–46.0)
Hemoglobin: 12.8 g/dL (ref 12.0–15.0)
MCH: 28.8 pg (ref 26.0–34.0)
MCHC: 30.8 g/dL (ref 30.0–36.0)
MCV: 93.3 fL (ref 80.0–100.0)
Platelets: 298 10*3/uL (ref 150–400)
RBC: 4.45 MIL/uL (ref 3.87–5.11)
RDW: 14.2 % (ref 11.5–15.5)
WBC: 14.3 10*3/uL — ABNORMAL HIGH (ref 4.0–10.5)
nRBC: 0 % (ref 0.0–0.2)

## 2021-06-30 LAB — CREATININE, SERUM
Creatinine, Ser: 1.73 mg/dL — ABNORMAL HIGH (ref 0.44–1.00)
GFR, Estimated: 33 mL/min — ABNORMAL LOW (ref >60)

## 2021-06-30 LAB — GLUCOSE, CAPILLARY
Glucose-Capillary: 107 mg/dL — ABNORMAL HIGH (ref 70–99)
Glucose-Capillary: 125 mg/dL — ABNORMAL HIGH (ref 70–99)
Glucose-Capillary: 272 mg/dL — ABNORMAL HIGH (ref 70–99)

## 2021-06-30 SURGERY — OPEN REDUCTION INTERNAL FIXATION (ORIF) DISTAL FEMUR FRACTURE
Anesthesia: General | Site: Leg Upper | Laterality: Right

## 2021-06-30 MED ORDER — DOCUSATE SODIUM 100 MG PO CAPS
100.0000 mg | ORAL_CAPSULE | Freq: Two times a day (BID) | ORAL | Status: DC
  Administered 2021-06-30 – 2021-07-04 (×9): 100 mg via ORAL
  Filled 2021-06-30 (×9): qty 1

## 2021-06-30 MED ORDER — LIDOCAINE 2% (20 MG/ML) 5 ML SYRINGE
INTRAMUSCULAR | Status: DC | PRN
  Administered 2021-06-30: 60 mg via INTRAVENOUS

## 2021-06-30 MED ORDER — HYDROMORPHONE HCL 1 MG/ML IJ SOLN
0.5000 mg | INTRAMUSCULAR | Status: DC | PRN
  Administered 2021-06-30 (×4): 0.5 mg via INTRAVENOUS

## 2021-06-30 MED ORDER — FENTANYL CITRATE (PF) 100 MCG/2ML IJ SOLN
INTRAMUSCULAR | Status: DC | PRN
Start: 2021-06-30 — End: 2021-06-30
  Administered 2021-06-30 (×2): 50 ug via INTRAVENOUS
  Administered 2021-06-30: 100 ug via INTRAVENOUS
  Administered 2021-06-30: 50 ug via INTRAVENOUS

## 2021-06-30 MED ORDER — PROPOFOL 10 MG/ML IV BOLUS
INTRAVENOUS | Status: DC | PRN
  Administered 2021-06-30: 200 mg via INTRAVENOUS

## 2021-06-30 MED ORDER — ESCITALOPRAM OXALATE 20 MG PO TABS
20.0000 mg | ORAL_TABLET | Freq: Every day | ORAL | Status: DC
  Administered 2021-06-30 – 2021-07-04 (×5): 20 mg via ORAL
  Filled 2021-06-30 (×5): qty 1

## 2021-06-30 MED ORDER — HYDROMORPHONE HCL 1 MG/ML IJ SOLN
INTRAMUSCULAR | Status: AC
  Filled 2021-06-30: qty 1

## 2021-06-30 MED ORDER — CEFAZOLIN SODIUM-DEXTROSE 2-4 GM/100ML-% IV SOLN
2.0000 g | Freq: Three times a day (TID) | INTRAVENOUS | Status: AC
  Administered 2021-06-30 – 2021-07-01 (×3): 2 g via INTRAVENOUS
  Filled 2021-06-30 (×3): qty 100

## 2021-06-30 MED ORDER — EPHEDRINE SULFATE-NACL 50-0.9 MG/10ML-% IV SOSY
PREFILLED_SYRINGE | INTRAVENOUS | Status: DC | PRN
  Administered 2021-06-30: 10 mg via INTRAVENOUS
  Administered 2021-06-30 (×3): 5 mg via INTRAVENOUS

## 2021-06-30 MED ORDER — VANCOMYCIN HCL 1000 MG IV SOLR
INTRAVENOUS | Status: AC
  Filled 2021-06-30: qty 20

## 2021-06-30 MED ORDER — ONDANSETRON HCL 4 MG/2ML IJ SOLN
INTRAMUSCULAR | Status: DC | PRN
  Administered 2021-06-30: 4 mg via INTRAVENOUS

## 2021-06-30 MED ORDER — ALBUTEROL SULFATE HFA 108 (90 BASE) MCG/ACT IN AERS: 2.0000 | INHALATION_SPRAY | RESPIRATORY_TRACT | Status: DC | PRN

## 2021-06-30 MED ORDER — ORAL CARE MOUTH RINSE: 15.0000 mL | Freq: Once | OROMUCOSAL | Status: AC

## 2021-06-30 MED ORDER — AMISULPRIDE (ANTIEMETIC) 5 MG/2ML IV SOLN: 10.0000 mg | Freq: Once | INTRAVENOUS | Status: DC | PRN

## 2021-06-30 MED ORDER — HYDROMORPHONE HCL 1 MG/ML IJ SOLN
0.5000 mg | INTRAMUSCULAR | Status: DC | PRN
Start: 2021-06-30 — End: 2021-06-30
  Administered 2021-06-30 (×2): 0.5 mg via INTRAVENOUS

## 2021-06-30 MED ORDER — ENOXAPARIN SODIUM 40 MG/0.4ML IJ SOSY
40.0000 mg | PREFILLED_SYRINGE | INTRAMUSCULAR | Status: DC
  Administered 2021-07-01: 40 mg via SUBCUTANEOUS
  Filled 2021-06-30: qty 0.4

## 2021-06-30 MED ORDER — ROCURONIUM BROMIDE 10 MG/ML (PF) SYRINGE
PREFILLED_SYRINGE | INTRAVENOUS | Status: DC | PRN
  Administered 2021-06-30: 10 mg via INTRAVENOUS
  Administered 2021-06-30: 20 mg via INTRAVENOUS
  Administered 2021-06-30: 60 mg via INTRAVENOUS

## 2021-06-30 MED ORDER — INSULIN ASPART 100 UNIT/ML IJ SOLN: 0.0000 [IU] | INTRAMUSCULAR | Status: DC | PRN

## 2021-06-30 MED ORDER — 0.9 % SODIUM CHLORIDE (POUR BTL) OPTIME
TOPICAL | Status: DC | PRN
  Administered 2021-06-30: 1000 mL

## 2021-06-30 MED ORDER — PHENYLEPHRINE HCL (PRESSORS) 10 MG/ML IV SOLN
INTRAVENOUS | Status: AC
  Filled 2021-06-30: qty 1

## 2021-06-30 MED ORDER — DICYCLOMINE HCL 10 MG PO CAPS
10.0000 mg | ORAL_CAPSULE | Freq: Three times a day (TID) | ORAL | Status: DC
  Administered 2021-06-30 – 2021-07-04 (×11): 10 mg via ORAL
  Filled 2021-06-30 (×11): qty 1

## 2021-06-30 MED ORDER — INSULIN ASPART 100 UNIT/ML IJ SOLN
0.0000 [IU] | Freq: Every day | INTRAMUSCULAR | Status: DC
  Administered 2021-06-30: 2 [IU] via SUBCUTANEOUS

## 2021-06-30 MED ORDER — OXYCODONE HCL 5 MG PO TABS
5.0000 mg | ORAL_TABLET | ORAL | Status: DC | PRN
  Administered 2021-06-30 – 2021-07-02 (×3): 10 mg via ORAL
  Filled 2021-06-30 (×3): qty 2

## 2021-06-30 MED ORDER — ACETAMINOPHEN 10 MG/ML IV SOLN
1000.0000 mg | Freq: Once | INTRAVENOUS | Status: AC
  Administered 2021-06-30: 1000 mg via INTRAVENOUS

## 2021-06-30 MED ORDER — KETOROLAC TROMETHAMINE 15 MG/ML IJ SOLN
INTRAMUSCULAR | Status: AC
  Filled 2021-06-30: qty 1

## 2021-06-30 MED ORDER — HYDRALAZINE HCL 25 MG PO TABS
25.0000 mg | ORAL_TABLET | Freq: Three times a day (TID) | ORAL | Status: DC
  Administered 2021-06-30 – 2021-07-03 (×2): 25 mg via ORAL
  Filled 2021-06-30 (×6): qty 1

## 2021-06-30 MED ORDER — INSULIN ASPART 100 UNIT/ML IJ SOLN
0.0000 [IU] | Freq: Three times a day (TID) | INTRAMUSCULAR | Status: DC
  Administered 2021-06-30: 8 [IU] via SUBCUTANEOUS
  Administered 2021-07-01 (×2): 2 [IU] via SUBCUTANEOUS
  Administered 2021-07-01: 8 [IU] via SUBCUTANEOUS
  Administered 2021-07-02: 2 [IU] via SUBCUTANEOUS
  Administered 2021-07-02 (×2): 3 [IU] via SUBCUTANEOUS
  Administered 2021-07-03 – 2021-07-04 (×2): 2 [IU] via SUBCUTANEOUS

## 2021-06-30 MED ORDER — VANCOMYCIN HCL 1000 MG IV SOLR
INTRAVENOUS | Status: DC | PRN
  Administered 2021-06-30: 1000 mg

## 2021-06-30 MED ORDER — PHENYLEPHRINE 80 MCG/ML (10ML) SYRINGE FOR IV PUSH (FOR BLOOD PRESSURE SUPPORT)
PREFILLED_SYRINGE | INTRAVENOUS | Status: DC | PRN
  Administered 2021-06-30: 160 ug via INTRAVENOUS
  Administered 2021-06-30 (×3): 80 ug via INTRAVENOUS
  Administered 2021-06-30: 160 ug via INTRAVENOUS

## 2021-06-30 MED ORDER — CEFAZOLIN IN SODIUM CHLORIDE 3-0.9 GM/100ML-% IV SOLN
3.0000 g | INTRAVENOUS | Status: AC
  Administered 2021-06-30: 3 g via INTRAVENOUS
  Filled 2021-06-30: qty 100

## 2021-06-30 MED ORDER — GABAPENTIN 400 MG PO CAPS
800.0000 mg | ORAL_CAPSULE | Freq: Three times a day (TID) | ORAL | Status: DC
Start: 2021-06-30 — End: 2021-07-03
  Administered 2021-06-30 – 2021-07-03 (×9): 800 mg via ORAL
  Filled 2021-06-30 (×9): qty 2

## 2021-06-30 MED ORDER — OXYCODONE HCL 5 MG PO TABS
10.0000 mg | ORAL_TABLET | ORAL | Status: DC | PRN
  Administered 2021-07-01: 15 mg via ORAL
  Administered 2021-07-01: 10 mg via ORAL
  Filled 2021-06-30: qty 2
  Filled 2021-06-30 (×3): qty 3

## 2021-06-30 MED ORDER — KETAMINE HCL 50 MG/5ML IJ SOSY
PREFILLED_SYRINGE | INTRAMUSCULAR | Status: AC
  Filled 2021-06-30: qty 5

## 2021-06-30 MED ORDER — LACTATED RINGERS IV SOLN: INTRAVENOUS | Status: DC

## 2021-06-30 MED ORDER — FENTANYL CITRATE (PF) 100 MCG/2ML IJ SOLN
25.0000 ug | INTRAMUSCULAR | Status: DC | PRN
  Administered 2021-06-30 (×2): 50 ug via INTRAVENOUS
  Administered 2021-06-30 (×2): 25 ug via INTRAVENOUS

## 2021-06-30 MED ORDER — PHENYLEPHRINE HCL-NACL 20-0.9 MG/250ML-% IV SOLN
INTRAVENOUS | Status: DC | PRN
Start: 2021-06-30 — End: 2021-06-30
  Administered 2021-06-30: 50 ug/min via INTRAVENOUS

## 2021-06-30 MED ORDER — KETOROLAC TROMETHAMINE 15 MG/ML IJ SOLN
15.0000 mg | Freq: Once | INTRAMUSCULAR | Status: AC
  Administered 2021-06-30: 15 mg via INTRAVENOUS

## 2021-06-30 MED ORDER — POLYETHYLENE GLYCOL 3350 17 G PO PACK: 17.0000 g | PACK | Freq: Every day | ORAL | Status: DC | PRN

## 2021-06-30 MED ORDER — TIZANIDINE HCL 2 MG PO TABS
4.0000 mg | ORAL_TABLET | Freq: Three times a day (TID) | ORAL | Status: DC | PRN
  Administered 2021-06-30 – 2021-07-03 (×4): 4 mg via ORAL
  Filled 2021-06-30 (×4): qty 2

## 2021-06-30 MED ORDER — PANTOPRAZOLE SODIUM 40 MG PO TBEC
40.0000 mg | DELAYED_RELEASE_TABLET | Freq: Every day | ORAL | Status: DC
  Administered 2021-06-30 – 2021-07-04 (×5): 40 mg via ORAL
  Filled 2021-06-30 (×5): qty 1

## 2021-06-30 MED ORDER — ACETAMINOPHEN 10 MG/ML IV SOLN
INTRAVENOUS | Status: AC
  Filled 2021-06-30: qty 100

## 2021-06-30 MED ORDER — AMLODIPINE BESYLATE 5 MG PO TABS
5.0000 mg | ORAL_TABLET | Freq: Every day | ORAL | Status: DC
  Filled 2021-06-30 (×2): qty 1

## 2021-06-30 MED ORDER — SUGAMMADEX SODIUM 500 MG/5ML IV SOLN
INTRAVENOUS | Status: DC | PRN
  Administered 2021-06-30: 300 mg via INTRAVENOUS

## 2021-06-30 MED ORDER — FENTANYL CITRATE (PF) 100 MCG/2ML IJ SOLN
INTRAMUSCULAR | Status: AC
  Filled 2021-06-30: qty 2

## 2021-06-30 MED ORDER — FENTANYL CITRATE (PF) 250 MCG/5ML IJ SOLN
INTRAMUSCULAR | Status: AC
  Filled 2021-06-30: qty 5

## 2021-06-30 MED ORDER — PROPOFOL 10 MG/ML IV BOLUS
INTRAVENOUS | Status: AC
  Filled 2021-06-30: qty 20

## 2021-06-30 MED ORDER — DIPHENHYDRAMINE HCL 12.5 MG/5ML PO ELIX: 12.5000 mg | ORAL_SOLUTION | ORAL | Status: DC | PRN

## 2021-06-30 MED ORDER — SODIUM CHLORIDE 0.9 % IV SOLN: INTRAVENOUS | Status: DC

## 2021-06-30 MED ORDER — ALBUTEROL SULFATE (2.5 MG/3ML) 0.083% IN NEBU: 2.5000 mg | INHALATION_SOLUTION | RESPIRATORY_TRACT | Status: DC | PRN

## 2021-06-30 MED ORDER — HYDROMORPHONE HCL 1 MG/ML IJ SOLN
0.5000 mg | INTRAMUSCULAR | Status: DC | PRN
  Administered 2021-07-01: 1 mg via INTRAVENOUS
  Filled 2021-06-30: qty 1

## 2021-06-30 MED ORDER — OLMESARTAN MEDOXOMIL-HCTZ 40-25 MG PO TABS: 1.0000 | ORAL_TABLET | Freq: Every day | ORAL | Status: DC

## 2021-06-30 MED ORDER — ACETAMINOPHEN 500 MG PO TABS
1000.0000 mg | ORAL_TABLET | Freq: Four times a day (QID) | ORAL | Status: DC
  Administered 2021-06-30 – 2021-07-04 (×14): 1000 mg via ORAL
  Filled 2021-06-30 (×16): qty 2

## 2021-06-30 MED ORDER — KETAMINE HCL 10 MG/ML IJ SOLN
INTRAMUSCULAR | Status: DC | PRN
Start: 2021-06-30 — End: 2021-06-30
  Administered 2021-06-30: 40 mg via INTRAVENOUS
  Administered 2021-06-30: 10 mg via INTRAVENOUS

## 2021-06-30 MED ORDER — DEXAMETHASONE SODIUM PHOSPHATE 4 MG/ML IJ SOLN
INTRAMUSCULAR | Status: DC | PRN
  Administered 2021-06-30: 4 mg via INTRAVENOUS

## 2021-06-30 MED ORDER — IRBESARTAN 300 MG PO TABS
300.0000 mg | ORAL_TABLET | Freq: Every day | ORAL | Status: DC
  Administered 2021-07-02 – 2021-07-04 (×3): 300 mg via ORAL
  Filled 2021-06-30 (×3): qty 1

## 2021-06-30 MED ORDER — EPHEDRINE 5 MG/ML INJ
INTRAVENOUS | Status: AC
  Filled 2021-06-30: qty 5

## 2021-06-30 MED ORDER — MIDAZOLAM HCL 2 MG/2ML IJ SOLN
INTRAMUSCULAR | Status: AC
  Filled 2021-06-30: qty 2

## 2021-06-30 MED ORDER — IBUPROFEN 800 MG PO TABS: 800.0000 mg | ORAL_TABLET | Freq: Three times a day (TID) | ORAL | Status: DC | PRN

## 2021-06-30 MED ORDER — PROMETHAZINE HCL 25 MG PO TABS
25.0000 mg | ORAL_TABLET | Freq: Four times a day (QID) | ORAL | Status: DC | PRN
  Administered 2021-07-03 – 2021-07-04 (×2): 25 mg via ORAL
  Filled 2021-06-30 (×2): qty 1

## 2021-06-30 MED ORDER — HYDROMORPHONE HCL 1 MG/ML IJ SOLN
0.5000 mg | INTRAMUSCULAR | Status: DC | PRN
  Administered 2021-06-30: 0.5 mg via INTRAVENOUS

## 2021-06-30 MED ORDER — FUROSEMIDE 20 MG PO TABS
20.0000 mg | ORAL_TABLET | Freq: Every day | ORAL | Status: DC
Start: 2021-06-30 — End: 2021-07-04
  Administered 2021-06-30 – 2021-07-04 (×4): 20 mg via ORAL
  Filled 2021-06-30 (×5): qty 1

## 2021-06-30 MED ORDER — HYDROCHLOROTHIAZIDE 25 MG PO TABS
25.0000 mg | ORAL_TABLET | Freq: Every day | ORAL | Status: DC
  Administered 2021-07-02 – 2021-07-04 (×3): 25 mg via ORAL
  Filled 2021-06-30 (×3): qty 1

## 2021-06-30 MED ORDER — CHLORHEXIDINE GLUCONATE 0.12 % MT SOLN
15.0000 mL | Freq: Once | OROMUCOSAL | Status: AC
  Administered 2021-06-30: 15 mL via OROMUCOSAL
  Filled 2021-06-30: qty 15

## 2021-06-30 MED ORDER — ACETAMINOPHEN 500 MG PO TABS
1000.0000 mg | ORAL_TABLET | Freq: Once | ORAL | Status: AC
  Administered 2021-06-30: 1000 mg via ORAL
  Filled 2021-06-30: qty 2

## 2021-06-30 MED ORDER — MIDAZOLAM HCL 5 MG/5ML IJ SOLN
INTRAMUSCULAR | Status: DC | PRN
  Administered 2021-06-30: 2 mg via INTRAVENOUS

## 2021-06-30 MED ORDER — MONTELUKAST SODIUM 10 MG PO TABS
10.0000 mg | ORAL_TABLET | Freq: Every day | ORAL | Status: DC
Start: 2021-06-30 — End: 2021-07-04
  Administered 2021-06-30 – 2021-07-04 (×5): 10 mg via ORAL
  Filled 2021-06-30 (×5): qty 1

## 2021-06-30 SURGICAL SUPPLY — 63 items
BAG COUNTER SPONGE SURGICOUNT (BAG) ×2 IMPLANT
BIT DRILL QC 3.3X195 (BIT) ×1 IMPLANT
BLADE CLIPPER SURG (BLADE) IMPLANT
BNDG ELASTIC 6X10 VLCR STRL LF (GAUZE/BANDAGES/DRESSINGS) ×1 IMPLANT
BONE CANC CHIPS 20CC PCAN1/4 (Bone Implant) ×2 IMPLANT
BRUSH SCRUB EZ PLAIN DRY (MISCELLANEOUS) ×4 IMPLANT
CANISTER SUCT 3000ML PPV (MISCELLANEOUS) ×2 IMPLANT
CAP LOCK NCB (Cap) ×8 IMPLANT
CHIPS CANC BONE 20CC PCAN1/4 (Bone Implant) ×1 IMPLANT
CHLORAPREP W/TINT 26 (MISCELLANEOUS) ×3 IMPLANT
COVER SURGICAL LIGHT HANDLE (MISCELLANEOUS) ×2 IMPLANT
DERMABOND ADVANCED (GAUZE/BANDAGES/DRESSINGS) ×2
DERMABOND ADVANCED .7 DNX12 (GAUZE/BANDAGES/DRESSINGS) IMPLANT
DRAPE C-ARM 42X72 X-RAY (DRAPES) ×2 IMPLANT
DRAPE C-ARMOR (DRAPES) ×2 IMPLANT
DRAPE HALF SHEET 40X57 (DRAPES) ×4 IMPLANT
DRAPE ORTHO SPLIT 77X108 STRL (DRAPES) ×4
DRAPE SURG 17X23 STRL (DRAPES) ×2 IMPLANT
DRAPE SURG ORHT 6 SPLT 77X108 (DRAPES) ×2 IMPLANT
DRAPE U-SHAPE 47X51 STRL (DRAPES) ×2 IMPLANT
DRSG PAD ABDOMINAL 8X10 ST (GAUZE/BANDAGES/DRESSINGS) ×6 IMPLANT
ELECT REM PT RETURN 9FT ADLT (ELECTROSURGICAL) ×2
ELECTRODE REM PT RTRN 9FT ADLT (ELECTROSURGICAL) ×1 IMPLANT
GLOVE BIO SURGEON STRL SZ 6.5 (GLOVE) ×6 IMPLANT
GLOVE BIO SURGEON STRL SZ7.5 (GLOVE) ×8 IMPLANT
GLOVE BIOGEL PI IND STRL 6.5 (GLOVE) ×1 IMPLANT
GLOVE BIOGEL PI IND STRL 7.5 (GLOVE) ×1 IMPLANT
GLOVE BIOGEL PI INDICATOR 6.5 (GLOVE) ×1
GLOVE BIOGEL PI INDICATOR 7.5 (GLOVE) ×1
GOWN STRL REUS W/ TWL LRG LVL3 (GOWN DISPOSABLE) ×3 IMPLANT
GOWN STRL REUS W/TWL LRG LVL3 (GOWN DISPOSABLE) ×6
GRAFT BNE CANC CHIPS 1-8 20CC (Bone Implant) IMPLANT
K-WIRE 2.0 (WIRE) ×2
K-WIRE FXSTD 280X2XNS SS (WIRE) ×1
KIT BASIN OR (CUSTOM PROCEDURE TRAY) ×2 IMPLANT
KIT PLATELET STD 3200 60X6 (KITS) ×1 IMPLANT
KIT TURNOVER KIT B (KITS) ×2 IMPLANT
KWIRE FXSTD 280X2XNS SS (WIRE) IMPLANT
NS IRRIG 1000ML POUR BTL (IV SOLUTION) ×2 IMPLANT
PACK TOTAL JOINT (CUSTOM PROCEDURE TRAY) ×2 IMPLANT
PAD ARMBOARD 7.5X6 YLW CONV (MISCELLANEOUS) ×4 IMPLANT
PAD CAST 4YDX4 CTTN HI CHSV (CAST SUPPLIES) ×1 IMPLANT
PADDING CAST COTTON 4X4 STRL (CAST SUPPLIES) ×2
PADDING CAST COTTON 6X4 STRL (CAST SUPPLIES) ×2 IMPLANT
PLATE FRACTURE NCB PA 174 H12 (Plate) ×1 IMPLANT
SCREW NCB 4.0MX38M (Screw) ×3 IMPLANT
SCREW NCB 4.0MX46M (Screw) ×2 IMPLANT
SCREW NCB 4.0MX50M (Screw) ×2 IMPLANT
SCREW NCB 4.0MX55M (Screw) ×1 IMPLANT
SCREW NCB 4.0X40MM (Screw) ×1 IMPLANT
SPONGE T-LAP 18X18 ~~LOC~~+RFID (SPONGE) IMPLANT
STAPLER VISISTAT 35W (STAPLE) ×2 IMPLANT
SUCTION FRAZIER HANDLE 10FR (MISCELLANEOUS) ×2
SUCTION TUBE FRAZIER 10FR DISP (MISCELLANEOUS) ×1 IMPLANT
SUT ETHILON 3 0 PS 1 (SUTURE) ×4 IMPLANT
SUT MNCRL AB 3-0 PS2 27 (SUTURE) ×1 IMPLANT
SUT VIC AB 0 CT1 27 (SUTURE)
SUT VIC AB 0 CT1 27XBRD ANBCTR (SUTURE) IMPLANT
SUT VIC AB 1 CT1 27 (SUTURE)
SUT VIC AB 1 CT1 27XBRD ANBCTR (SUTURE) IMPLANT
SUT VIC AB 2-0 CT1 27 (SUTURE) ×4
SUT VIC AB 2-0 CT1 TAPERPNT 27 (SUTURE) ×2 IMPLANT
TOWEL GREEN STERILE (TOWEL DISPOSABLE) ×4 IMPLANT

## 2021-06-30 NOTE — Evaluation (Signed)
Physical Therapy Evaluation ?Patient Details ?Name: Rebecca Kemp ?MRN: [id number] ?DOB: 09-18-1955 ?Today's Date: 06/30/2021 ? ?History of Present Illness ? [name] admitted on 06/30/21 due to R distal femur non union, s/p R femur ORIF; of note, initial fall with R femur ORIF 08/2020, has been followed at regular intervals by Dr Jena Gauss and failed conservative measures including bone stimulator. Other PMH includes HTN, asthma, DM2, GERD, anxiety, neuropathy, obesity. ?  ?Clinical Impression ? Pt presents with an overall decrease in functional mobility secondary to above. PTA, pt ambulatory with RW, lives with daughter who assists with ADL/iADLs as needed; pt has been dealing with RLE injury since initial fall 09/2020. Educ re: precautions, positioning, therex, activity recommendations. Today, pt able to initiate transfer and gait training with RW and minA. Pt would benefit from continued acute PT services to maximize functional mobility and independence prior to d/c with HHPT services.     ? ?Recommendations for follow up therapy are one component of a multi-disciplinary discharge planning process, led by the attending physician.  Recommendations may be updated based on patient status, additional functional criteria and insurance authorization. ? ?Follow Up Recommendations Home health PT ? ?  ?Assistance Recommended at Discharge Intermittent Supervision/Assistance  ?Patient can return home with the following ? A little help with walking and/or transfers;A lot of help with bathing/dressing/bathroom;Assistance with cooking/housework;Assist for transportation;Help with stairs or ramp for entrance ? ?  ?Equipment Recommendations None recommended by PT  ?Recommendations for Other Services ?    ?  ?Functional Status Assessment Patient has had a recent decline in their functional status and demonstrates the ability to make significant improvements in function in a reasonable and predictable amount of time.  ? ?   ?Precautions / Restrictions Precautions ?Precautions: Fall ?Restrictions ?Weight Bearing Restrictions: Yes ?RLE Weight Bearing: Weight bearing as tolerated  ? ?  ? ?Mobility ? Bed Mobility ?Overal bed mobility: Needs Assistance ?Bed Mobility: Supine to Sit ?  ?  ?Supine to sit: Min assist, HOB elevated ?  ?  ?General bed mobility comments: MinA for RLE management, use of bed rail ?  ? ?Transfers ?Overall transfer level: Needs assistance ?Equipment used: Rolling walker (2 wheels) ?Transfers: Sit to/from Stand ?Sit to Stand: Min guard, From elevated surface ?  ?  ?  ?  ?  ?General transfer comment: good awareness of hand placement, min guard for trnk elevation from elevated EOB; good eccentric lowering to recliner ?  ? ?Ambulation/Gait ?Ambulation/Gait assistance: Min guard ?Gait Distance (Feet): 10 Feet ?Assistive device: Rolling walker (2 wheels) ?Gait Pattern/deviations: Step-to pattern, Step-through pattern, Decreased stride length, Antalgic, Decreased weight shift to right ?Gait velocity: Decreased ?  ?  ?General Gait Details: slow, antalgic gait with RW and min guard for balance; good ability to progress to step-through gait pattern with cues ? ?Stairs ?  ?  ?  ?  ?  ? ?Wheelchair Mobility ?  ? ?Modified Rankin (Stroke Patients Only) ?  ? ?  ? ?Balance Overall balance assessment: Needs assistance ?  ?Sitting balance-Leahy Scale: Fair ?  ?  ?Standing balance support: Reliant on assistive device for balance ?Standing balance-Leahy Scale: Poor ?  ?  ?  ?  ?  ?  ?  ?  ?  ?  ?  ?  ?   ? ? ? ?Pertinent Vitals/Pain Pain Assessment ?Pain Assessment: Faces ?Faces Pain Scale: Hurts little more ?Pain Location: RLE with ROM/WB ?Pain Descriptors / Indicators: Discomfort, Grimacing, Guarding ?  Pain Intervention(s): Monitored during session, Limited activity within patient's tolerance, Premedicated before session  ? ? ?Home Living Family/patient expects to be discharged to:: Private residence ?Living Arrangements:  Spouse/significant other ?Available Help at Discharge: Family;Available 24 hours/day ?Type of Home: House ?Home Access: Stairs to enter ?Entrance Stairs-Rails: None (leans on brick wall) ?Entrance Stairs-Number of Steps: 1+1+4 ?Alternate Level Stairs-Number of Steps: Flight ?Home Layout: Two level;Bed/bath upstairs;Able to live on main level with bedroom/bathroom ?Home Equipment: Rolling Walker (2 wheels);BSC/3in1;Wheelchair - manual;Hospital bed ?Additional Comments: has been staying at daughter's home with hospital bed on main level, BSC for bathroom, occasionally getting up flight of stairs to use bathroom and shower with assist from daughter  ?  ?Prior Function Prior Level of Function : Needs assist ?  ?  ?  ?  ?  ?  ?Mobility Comments: Mod indep limited ambulator with RW; has been able to do stairs into/out of home without supervision, use of walker on stairs ?ADLs Comments: assisted for LB ADLs and all IADLs ?  ? ? ?Hand Dominance  ? Dominant Hand: Right ? ?  ?Extremity/Trunk Assessment  ? Upper Extremity Assessment ?Upper Extremity Assessment: Overall WFL for tasks assessed ?  ? ?Lower Extremity Assessment ?Lower Extremity Assessment: RLE deficits/detail ?RLE Deficits / Details: s/p R femur ORIF; able to perform partial SLR, LAQ, supine hip abd/add, ankle pumps ?  ? ?Cervical / Trunk Assessment ?Cervical / Trunk Assessment: Other exceptions (increased body habitus)  ?Communication  ? Communication: No difficulties  ?Cognition Arousal/Alertness: Awake/alert ?Behavior During Therapy: [facility] for tasks assessed/performed ?Overall Cognitive Status: Within Functional Limits for tasks assessed ?  ?  ?  ?  ?  ?  ?  ?  ?  ?  ?  ?  ?  ?  ?  ?  ?General Comments: husband helping with home set up and PLOF; noted some fatigue/lethargy, suspect related to medicine ?  ?  ? ?  ?General Comments General comments (skin integrity, edema, etc.): pt's husband Genevie Cheshire) present and supportive; increased time discussing d/c  recommendations, pt hopeful for plan to return to daughter's home ? ?  ?Exercises General Exercises - Lower Extremity ?Ankle Circles/Pumps: AROM, Both, Seated ?Heel Slides: AROM, Right, Supine (partial range) ?Hip ABduction/ADduction: AROM, Right, Supine ?Straight Leg Raises: AROM, Right, Supine (partial range)  ? ?Assessment/Plan  ?  ?PT Assessment Patient needs continued PT services  ?PT Problem List Decreased strength;Decreased range of motion;Decreased activity tolerance;Decreased balance;Decreased mobility;Pain ? ?   ?  ?PT Treatment Interventions DME instruction;Gait training;Stair training;Functional mobility training;Therapeutic activities;Therapeutic exercise;Balance training;Patient/family education   ? ?PT Goals (Current goals can be found in the Care Plan section)  ?Acute Rehab PT Goals ?Patient Stated Goal: d/c to daughter's home with assist ?PT Goal Formulation: With patient ?Time For Goal Achievement: 07/14/21 ?Potential to Achieve Goals: Good ? ?  ?Frequency Min 5X/week ?  ? ? ?Co-evaluation   ?  ?  ?  ?  ? ? ?  ?AM-PAC PT "6 Clicks" Mobility  ?Outcome Measure Help needed turning from your back to your side while in a flat bed without using bedrails?: A Little ?Help needed moving from lying on your back to sitting on the side of a flat bed without using bedrails?: A Little ?Help needed moving to and from a bed to a chair (including a wheelchair)?: A Little ?Help needed standing up from a chair using your arms (e.g., wheelchair or bedside chair)?: A Little ?Help needed to walk in hospital room?: A  Little ?Help needed climbing 3-5 steps with a railing? : A Lot ?6 Click Score: 17 ? ?  ?End of Session Equipment Utilized During Treatment: Gait belt ?Activity Tolerance: Patient tolerated treatment well ?Patient left: in chair;with call bell/phone within reach;with chair alarm set ?Nurse Communication: Mobility status ?PT Visit Diagnosis: Other abnormalities of gait and mobility (R26.89);Muscle weakness  (generalized) (M62.81);Pain ?Pain - Right/Left: Right ?Pain - part of body: Leg ?  ? ?Time: 4010-27251519-1534 ?PT Time Calculation (min) (ACUTE ONLY): 15 min ? ? ?Charges:   PT Evaluation ?$PT Eval Moderate Complexity: 1 Mod ?  ?  ?   ?J

## 2021-06-30 NOTE — Transfer of Care (Signed)
Immediate Anesthesia Transfer of Care Note ? ?Patient: Rebecca Kemp ? ?Procedure(s) Performed: REPAIR OF RIGHT DISTAL FEMUR NONUNION (Right: Leg Upper) ? ?Patient Location: PACU ? ?Anesthesia Type:General ? ?Level of Consciousness: awake and alert  ? ?Airway & Oxygen Therapy: Patient Spontanous Breathing and Patient connected to face mask oxygen ? ?Post-op Assessment: Report given to RN and Post -op Vital signs reviewed and stable ? ?Post vital signs: Reviewed and stable ? ?Last Vitals:  ?Vitals Value Taken Time  ?BP 164/73 06/30/21 1009  ?Temp    ?Pulse 92 06/30/21 1011  ?Resp 15 06/30/21 1011  ?SpO2 89 % 06/30/21 1011  ?Vitals shown include unvalidated device data. ? ?Last Pain:  ?Vitals:  ? 06/30/21 0630  ?TempSrc:   ?PainSc: 7   ?   ? ?  ? ?Complications: No notable events documented. ?

## 2021-06-30 NOTE — Op Note (Signed)
Orthopaedic Surgery Operative Note (CSN: [id number] ) ?Date of Surgery: 06/30/2021  ?Admit Date: 06/30/2021  ? ?Diagnoses: ?Pre-Op Diagnoses: ?Right distal femur nonunion ? ?Post-Op Diagnosis: ?Same ? ?Procedures: ?CPT 27472-Repair of nonunion right distal femur ?CPT 27511-Open reduction internal fixation of right distal femur ? ?Surgeons : ?Primary: Roby Lofts, MD ? ?Assistant: Ulyses Southward, PA-C ? ?Location: OR 6  ? ?Anesthesia:General  ? ?Antibiotics: Ancef 3g preop with 1 gm vancomycin powder placed topically ? ?Tourniquet time: None   ? ?Estimated Blood Loss:200 mL ? ?Complications:None ? ?Specimens:None  ? ?Implants: ?Implant Name Type Inv. Item Serial No. Manufacturer Lot No. LRB No. Used Action  ?PLATE FRACTURE NCB PA 174 H12 - [id number] Plate PLATE FRACTURE NCB PA 174 H12  [employer](ORTH,TRAU,BIO,SG)  Right 1 Implanted  ?SCREW NCB 4.0QM57Q - [id number] Screw SCREW NCB 4.4XL24M  [employer](ORTH,TRAU,BIO,SG)  Right 1 Implanted  ?SCREW NCB 4.0NU27O - [id number] Screw SCREW NCB 4.7QQ59D  [employer](ORTH,TRAU,BIO,SG)  Right 1 Implanted  ?SCREW NCB 4.0MX50M - [id number] Screw SCREW NCB 4.0MX50M  [employer](ORTH,TRAU,BIO,SG)  Right 2 Implanted  ?SCREW NCB 4.0MX38M - [id number] Screw SCREW NCB 4.0MX38M  [employer](ORTH,TRAU,BIO,SG)  Right 3 Implanted  ?SCREW NCB 4.0X40MM - [id number] Screw SCREW NCB 4.0X40MM  [employer](ORTH,TRAU,BIO,SG)  Right 1 Implanted  ?CAP LOCK NCB - [id number] Cap CAP LOCK NCB  [employer](ORTH,TRAU,BIO,SG)  Right 8 Implanted  ?BONE [facility] CHIPS [facility] PCAN1/4 - [id number] Bone Implant BONE [facility] CHIPS [facility] PCAN1/4 [id number] [facility]  Right 1 Implanted  ?  ? ?Indications for Surgery: ?66 year old female who sustained a right extra-articular distal femur fracture treated with open reduction internal fixation and June 2022.  She subsequently went on to develop a nonunion.  Nonoperative management was attempted with a bone stimulator as well as correction of her diabetes.   Unfortunately she continued to have persistent pain with CT scan confirmed nonunion at her distal femur.  I recommended proceeding with a repair for nonunion with iliac crest bone marrow aspirate with medial plating.  Risks and benefits were discussed with the patient.  Risks included but not limited to bleeding, infection, malunion, nonunion hardware irritation, nerve blood vessel injury, DVT, even the possibility anesthetic complications.  She agreed to proceed with surgery and consent was obtained. ? ?Operative Findings: ?1.  Debridement and repair of right nonunion using bone marrow aspirate concentrate combined with crushed cancellous allograft. ?2.  Open reduction internal fixation of right distal femur using [employer] straight NCB plate along the cortex for supplemental support ? ?Procedure: ?The patient was identified in the preoperative holding area. Consent was confirmed with the patient and their family and all questions were answered. The operative extremity was marked after confirmation with the patient. she was then brought back to the operating room by our anesthesia colleagues.  She was carefully transferred over to radiolucent flat top table.  She was placed under general anesthetic.  A bump was placed under her operative hip.  The right lower extremity was then prepped and draped in usual sterile fashion.  A timeout was performed to verify the patient, the procedure, and the extremity.  Preoperative antibiotics were dosed. ? ?AP knee were flexed over a triangle.  Fluoroscopic imaging showed the nonunion.  Hardware of the lateral side was still in place.  I made a medial approach to the distal femur.  Carried it down through skin and subcutaneous tissue.  I incised through the vastus medialis fascia.  I then mobilized the medialis to access the medial  condyle.  I exposed this and then exposed the nonunion site.  I then proceeded to debride the nonunion site using a curette, rongeur, and a Cobb  elevator.  I removed all the fibrous tissue that was in place.  I was able to get back to healthy cancellous bone.  I used a 2.0 mm guidewire to drill holes in the proximal and distal segments to stimulate bleeding.  I then turned my attention to harvesting the bone marrow from her pelvis. ? ?I palpated her iliac crest and used a large bore needle from the harvesting set to enter the pelvic bone.  I then drew off 60 cc of bone marrow that combined with the anticoagulant.  I passed this off to the back table and the centrifuge was used to spin this down to a concentrate.  We then combined this with crushed cancellous allograft to place at the nonunion site.  I then chose a [employer] NCB straight plate into place along the medial cortex to provide support for the nonunion.  The distal portion of the plate was pinned in place.  I then slid it underneath the vastus medialis and proceeded to drill and placed a nonlocking screw proximal to the nonunion site to bring the plate close to bone.  A total of 5 screws were placed proximally.  I did split the quadriceps muscle belly to prevent any iatrogenic damage to the artery.  I then placed 3 locking screws distal into the medial femoral condyle.  Locking caps were placed on the distal screws and 3 locking caps were placed on the proximal screws.  The most proximal and middle screws were left without locking caps. ? ?Once I had fixation in place fluoroscopic imaging was obtained to show correct positioning.  I then placed the crushed cancellous with the bone marrow aspirate concentrate into the nonunion site.  I was able to pack it with with no gaps remaining.  Final fluoroscopic imaging was obtained.  The incision was irrigated.  A gram of vancomycin powder was placed into the incision.  A layered closure of 0 Vicryl, 2-0 Vicryl and Monocryl Dermabond was used to close the skin.  Sterile dressings were applied.  The patient was then awoke from anesthesia and taken to the  PACU in stable condition. ? ?Post Op Plan/Instructions: ?Patient will be weightbearing as tolerated to the right lower extremity.  She will receive postoperative Ancef.  She will receive Lovenox for DVT prophylaxis and discharged on aspirin 325 mg twice daily.  We will have her mobilize with physical and Occupational Therapy. ? ?I was present and performed the entire surgery. ? ?Ulyses Southward, PA-C did assist me throughout the case. An assistant was necessary given the difficulty in approach, maintenance of reduction and ability to instrument the fracture. ? ? ?Truitt Merle, MD ?[facility]  ?

## 2021-06-30 NOTE — Progress Notes (Signed)
Patient arrived to [facility] room 30 alert and oriented x4. Pain level 5/10. Bed in lowest position call light in reach. Will continue to monitor patient. Lunch tray ordered. ?

## 2021-06-30 NOTE — Progress Notes (Incomplete)
Inpatient Diabetes Program Recommendations ? ?AACE/ADA: New Consensus Statement on Inpatient Glycemic Control (2015) ? ?Target Ranges:  Prepandial:   less than 140 mg/dL ?     Peak postprandial:   less than 180 mg/dL (1-2 hours) ?     Critically ill patients:  140 - 180 mg/dL  ? ?Lab Results  ?Component Value Date  ? GLUCAP 125 (H) 06/30/2021  ? HGBA1C 5.9 (H) 06/22/2021  ? ? ?Review of Glycemic Control ? Latest Reference Range & Units 06/30/21 05:56 06/30/21 10:12  ?Glucose-Capillary 70 - 99 mg/dL 967 (H) 591 (H)  ?(H): Data is abnormally high ? ?Diabetes history: DM ?Outpatient Diabetes medications: Tresiba 48 units QD, Victoza 1.8 mg QD ?Current orders for Inpatient glycemic control: Novolog 0-15 unit TID & 0-5 units QHS, Decadron 4 mg x 1 this morning.  ? ?Received referral for assistance with DM management.   ? ? ? ? ?

## 2021-06-30 NOTE — Evaluation (Signed)
Occupational Therapy Evaluation ?Patient Details ?Name: Rebecca Kemp ?MRN: [id number] ?DOB: [birth year] ?Today's Date: 06/30/2021 ? ? ?History of Present Illness Pt is a 66 year old woman admitted on 06/30/21 due to R distal femur non union, underwent ORIF. Pt sustained fx in a fall in June 2022 and had ORIF. Has been followed at regular intervals by Dr Jena Gauss and failed conservative measures including bone stimulator PMH: HTN, asthma, DM2, GERD, anxiety, neuropathy, obesity.  ? ?Clinical Impression ?  ?Pt was ambulating with a RW and assisted with LB ADLs and IADLs prior to admission. She presents with post operative pain in R LE and impaired balance. She requires min assist for bed mobility (for R LE) and min guard assist for OOB with RW. Pt recovered at her daughter's home after her last surgery and hopes to do the same upon discharge. Will follow acutely.  ?   ? ?Recommendations for follow up therapy are one component of a multi-disciplinary discharge planning process, led by the attending physician.  Recommendations may be updated based on patient status, additional functional criteria and insurance authorization.  ? ?Follow Up Recommendations ? No OT follow up  ?  ?Assistance Recommended at Discharge Intermittent Supervision/Assistance  ?Patient can return home with the following A little help with walking and/or transfers;A lot of help with bathing/dressing/bathroom;Assistance with cooking/housework;Assist for transportation;Help with stairs or ramp for entrance ? ?  ?Functional Status Assessment ? Patient has had a recent decline in their functional status and demonstrates the ability to make significant improvements in function in a reasonable and predictable amount of time.  ?Equipment Recommendations ? None recommended by OT  ?  ?Recommendations for Other Services   ? ? ?  ?Precautions / Restrictions Precautions ?Precautions: Fall ?Restrictions ?Weight Bearing Restrictions: Yes ?RLE Weight Bearing: Weight  bearing as tolerated  ? ?  ? ?Mobility Bed Mobility ?Overal bed mobility: Needs Assistance ?Bed Mobility: Supine to Sit ?  ?  ?Supine to sit: Min assist ?  ?  ?General bed mobility comments: assisted R LE toward EOB, HOB up ?  ? ?Transfers ?Overall transfer level: Needs assistance ?Equipment used: Rolling walker (2 wheels) ?Transfers: Sit to/from Stand ?Sit to Stand: Min guard, From elevated surface ?  ?  ?  ?  ?  ?  ?  ? ?  ?Balance Overall balance assessment: Needs assistance ?  ?Sitting balance-Leahy Scale: Good ?  ?  ?Standing balance support: Bilateral upper extremity supported ?Standing balance-Leahy Scale: Poor ?Standing balance comment: reliant on B UE support ?  ?  ?  ?  ?  ?  ?  ?  ?  ?  ?  ?   ? ?ADL either performed or assessed with clinical judgement  ? ?ADL Overall ADL's : Needs assistance/impaired ?Eating/Feeding: Independent;Sitting ?  ?Grooming: Set up;Sitting ?  ?Upper Body Bathing: Minimal assistance;Sitting ?  ?Lower Body Bathing: Maximal assistance;Sit to/from stand ?  ?Upper Body Dressing : Set up;Sitting ?  ?Lower Body Dressing: Total assistance;Bed level ?  ?Toilet Transfer: Min guard;Ambulation;BSC/3in1;Rolling walker (2 wheels) ?  ?Toileting- Clothing Manipulation and Hygiene: Minimal assistance;Sit to/from stand ?  ?  ?  ?Functional mobility during ADLs: Min guard;Rolling walker (2 wheels) ?   ? ? ? ?Vision Baseline Vision/History: 1 Wears glasses ?Ability to See in Adequate Light: 0 Adequate ?Patient Visual Report: No change from baseline ?   ?   ?Perception   ?  ?Praxis   ?  ? ?Pertinent Vitals/Pain Pain Assessment ?Pain Assessment:  Faces ?Faces Pain Scale: Hurts little more ?Pain Location: R LE with weight bearing ?Pain Descriptors / Indicators: Discomfort, Grimacing, Guarding ?Pain Intervention(s): Monitored during session, Repositioned, Premedicated before session  ? ? ? ?Hand Dominance Right ?  ?Extremity/Trunk Assessment Upper Extremity Assessment ?Upper Extremity Assessment:  Overall WFL for tasks assessed ?  ?Lower Extremity Assessment ?Lower Extremity Assessment: Defer to PT evaluation ?  ?Cervical / Trunk Assessment ?Cervical / Trunk Assessment: Normal;Other exceptions (increased body habitus) ?  ?Communication Communication ?Communication: No difficulties ?  ?Cognition Arousal/Alertness: Awake/alert ?Behavior During Therapy: [facility] for tasks assessed/performed ?Overall Cognitive Status: Within Functional Limits for tasks assessed ?  ?  ?  ?  ?  ?  ?  ?  ?  ?  ?  ?  ?  ?  ?  ?  ?General Comments: husband helping with home set up and PLOF ?  ?  ?General Comments    ? ?  ?Exercises   ?  ?Shoulder Instructions    ? ? ?Home Living Family/patient expects to be discharged to:: Private residence ?Living Arrangements: Spouse/significant other ?Available Help at Discharge: Family;Available 24 hours/day ?Type of Home: House ?Home Access: Stairs to enter ?Entrance Stairs-Number of Steps: 1+1+4 ?Entrance Stairs-Rails: None (leans on brick wall) ?Home Layout: Two level;Bed/bath upstairs;Able to live on main level with bedroom/bathroom ?Alternate Level Stairs-Number of Steps: Flight ?Alternate Level Stairs-Rails: Right ?Bathroom Shower/Tub: Tub/shower unit ?  ?Bathroom Toilet: Standard ?  ?  ?Home Equipment: Rolling Walker (2 wheels);BSC/3in1;Wheelchair - manual;Hospital bed ?  ?Additional Comments: has been staying at daughter's home with hospital bed on main level, BSC for bathroom, occasionally getting up flight of stairs to use bathroom and shower with assist from daughter ?  ? ?  ?Prior Functioning/Environment Prior Level of Function : Needs assist ?  ?  ?  ?  ?  ?  ?Mobility Comments: Mod indep limited ambulator with RW; has been able to do stairs into/out of home without supervision, use of walker on stairs ?ADLs Comments: assisted for LB ADLs and all IADLs ?  ? ?  ?  ?OT Problem List: Impaired balance (sitting and/or standing);Decreased knowledge of use of DME or AE;Obesity;Pain ?  ?   ?OT  Treatment/Interventions: Self-care/ADL training;DME and/or AE instruction;Patient/family education;Therapeutic activities;Balance training  ?  ?OT Goals(Current goals can be found in the care plan section) Acute Rehab OT Goals ?OT Goal Formulation: With patient ?Time For Goal Achievement: 07/14/21 ?Potential to Achieve Goals: Good ?ADL Goals ?Pt Will Perform Grooming: with supervision;standing ?Pt Will Transfer to Toilet: with supervision;ambulating;bedside commode ?Pt Will Perform Toileting - Clothing Manipulation and hygiene: with supervision;sit to/from stand ?Additional ADL Goal #1: Pt will be aware of AE for LB ADLs.  ?OT Frequency: Min 2X/week ?  ? ?Co-evaluation   ?  ?  ?  ?  ? ?  ?AM-PAC OT "6 Clicks" Daily Activity     ?Outcome Measure Help from another person eating meals?: None ?Help from another person taking care of personal grooming?: A Little ?Help from another person toileting, which includes using toliet, bedpan, or urinal?: A Little ?Help from another person bathing (including washing, rinsing, drying)?: A Lot ?Help from another person to put on and taking off regular upper body clothing?: A Little ?Help from another person to put on and taking off regular lower body clothing?: Total ?6 Click Score: 16 ?  ?End of Session Equipment Utilized During Treatment: Rolling walker (2 wheels) ? ?Activity Tolerance: Patient tolerated treatment well ?Patient left:  in chair;with call bell/phone within reach;with chair alarm set;with family/visitor present ? ?OT Visit Diagnosis: Unsteadiness on feet (R26.81);Other abnormalities of gait and mobility (R26.89);Pain  ?              ?Time: [id number] ?OT Time Calculation (min): 14 min ?Charges:  OT General Charges ?$OT Visit: 1 Visit ?OT Evaluation ?$OT Eval Moderate Complexity: 1 Mod ?Martie RoundJulie Thurmon Mizell, OTR/L ?Acute Rehabilitation Services ?Pager: [phone] ?Office: [phone]  ?Evern BioMayberry, Nabiha Planck Lynn ?06/30/2021, 3:53 PM ?

## 2021-06-30 NOTE — Progress Notes (Signed)
Orthopedic Tech Progress Note ?Patient Details:  ?Magdelena M Kroh ?1955/10/23 ?[id number] ? ?Cant apply OVER HEAD FRAME due to in house frames max weight is 113.0 KG (250 LBS) patient is 167.8 KG (368.1 LBS) ? ?Patient ID: PEGGIE SHINES, female   DOB: 1955-04-07, 66 y.o.   MRN: [id number] ? ?Janit Pagan ?06/30/2021, 1:14 PM ? ?

## 2021-06-30 NOTE — Interval H&P Note (Signed)
History and Physical Interval Note: ? ?06/30/2021 ?7:28 AM ? ?Rebecca Kemp  has presented today for surgery, with the diagnosis of Right femur nonunion.  The various methods of treatment have been discussed with the patient and family. After consideration of risks, benefits and other options for treatment, the patient has consented to  Procedure(s): ?REPAIR OF RIGHT DISTAL FEMUR NONUNION (Right) as a surgical intervention.  The patient's history has been reviewed, patient examined, no change in status, stable for surgery.  I have reviewed the patient's chart and labs.  Questions were answered to the patient's satisfaction.   ? ? ?Caryn Bee P Italo Banton ? ? ?

## 2021-06-30 NOTE — Anesthesia Procedure Notes (Signed)
Procedure Name: Intubation ?Date/Time: 06/30/2021 7:43 AM ?Performed by: Lieutenant Diego, CRNA ?Pre-anesthesia Checklist: Patient identified, Emergency Drugs available, Suction available and Patient being monitored ?Patient Re-evaluated:Patient Re-evaluated prior to induction ?Oxygen Delivery Method: Circle system utilized ?Preoxygenation: Pre-oxygenation with 100% oxygen ?Induction Type: IV induction ?Ventilation: Mask ventilation without difficulty ?Laryngoscope Size: Glidescope ?Grade View: Grade I ?Tube type: Oral ?Tube size: 7.0 mm ?Number of attempts: 1 ?Airway Equipment and Method: Stylet and Oral airway ?Placement Confirmation: ETT inserted through vocal cords under direct vision, positive ETCO2 and breath sounds checked- equal and bilateral ?Secured at: 23 cm ?Tube secured with: Tape ?Dental Injury: Teeth and Oropharynx as per pre-operative assessment  ? ? ? ? ?

## 2021-06-30 NOTE — Progress Notes (Signed)
Mobility Specialist Progress Note: ? ? 06/30/21 1659  ?Mobility  ?Activity Ambulated with assistance in room;Transferred to/from [facility]  ?Level of Assistance Minimal assist, patient does 75% or more  ?Assistive Device Front wheel walker;BSC  ?RLE Weight Bearing WBAT  ?Distance Ambulated (ft) 8 ft  ?Activity Response Tolerated well  ?$Mobility charge 1 Mobility  ? ?Pt received in chair asking to go back to bed. Complaints of R hip pain.Min A to stand from chair and from [facility]. Left in bed with call bell in reach and all needs met.  ? ?Rebecca Kemp ?Mobility Specialist ?Primary Phone [phone] ? ?

## 2021-07-01 LAB — BASIC METABOLIC PANEL
Anion gap: 8 (ref 5–15)
BUN: 35 mg/dL — ABNORMAL HIGH (ref 8–23)
CO2: 25 mmol/L (ref 22–32)
Calcium: 8.9 mg/dL (ref 8.9–10.3)
Chloride: 101 mmol/L (ref 98–111)
Creatinine, Ser: 2.21 mg/dL — ABNORMAL HIGH (ref 0.44–1.00)
GFR, Estimated: 24 mL/min — ABNORMAL LOW (ref >60)
Glucose, Bld: 272 mg/dL — ABNORMAL HIGH (ref 70–99)
Potassium: 4.4 mmol/L (ref 3.5–5.1)
Sodium: 134 mmol/L — ABNORMAL LOW (ref 135–145)

## 2021-07-01 LAB — GLUCOSE, CAPILLARY
Glucose-Capillary: 231 mg/dL — ABNORMAL HIGH (ref 70–99)
Glucose-Capillary: 251 mg/dL — ABNORMAL HIGH (ref 70–99)
Glucose-Capillary: 143 mg/dL — ABNORMAL HIGH (ref 70–99)
Glucose-Capillary: 148 mg/dL — ABNORMAL HIGH (ref 70–99)
Glucose-Capillary: 160 mg/dL — ABNORMAL HIGH (ref 70–99)

## 2021-07-01 LAB — CBC
HCT: 32.5 % — ABNORMAL LOW (ref 36.0–46.0)
Hemoglobin: 10.4 g/dL — ABNORMAL LOW (ref 12.0–15.0)
MCH: 30.2 pg (ref 26.0–34.0)
MCHC: 32 g/dL (ref 30.0–36.0)
MCV: 94.5 fL (ref 80.0–100.0)
Platelets: 235 10*3/uL (ref 150–400)
RBC: 3.44 MIL/uL — ABNORMAL LOW (ref 3.87–5.11)
RDW: 14.4 % (ref 11.5–15.5)
WBC: 10 10*3/uL (ref 4.0–10.5)
nRBC: 0 % (ref 0.0–0.2)

## 2021-07-01 MED ORDER — PNEUMOCOCCAL 20-VAL CONJ VACC 0.5 ML IM SUSY
0.5000 mL | PREFILLED_SYRINGE | INTRAMUSCULAR | Status: AC
  Administered 2021-07-02: 0.5 mL via INTRAMUSCULAR
  Filled 2021-07-01: qty 0.5

## 2021-07-01 MED ORDER — KETOROLAC TROMETHAMINE 15 MG/ML IJ SOLN
15.0000 mg | Freq: Four times a day (QID) | INTRAMUSCULAR | Status: AC
Start: 2021-07-01 — End: 2021-07-02
  Administered 2021-07-01 – 2021-07-02 (×5): 15 mg via INTRAVENOUS
  Filled 2021-07-01 (×5): qty 1

## 2021-07-01 MED ORDER — ENOXAPARIN SODIUM 80 MG/0.8ML IJ SOSY
80.0000 mg | PREFILLED_SYRINGE | INTRAMUSCULAR | Status: DC
  Administered 2021-07-02 – 2021-07-04 (×3): 80 mg via SUBCUTANEOUS
  Filled 2021-07-01 (×3): qty 0.8

## 2021-07-01 MED ORDER — SODIUM CHLORIDE 0.9 % IV BOLUS
1000.0000 mL | Freq: Once | INTRAVENOUS | Status: AC
  Administered 2021-07-01: 1000 mL via INTRAVENOUS

## 2021-07-01 MED ORDER — TRAMADOL HCL 50 MG PO TABS
50.0000 mg | ORAL_TABLET | Freq: Four times a day (QID) | ORAL | Status: DC | PRN
  Administered 2021-07-02: 50 mg via ORAL
  Filled 2021-07-01: qty 1

## 2021-07-01 MED ORDER — SODIUM CHLORIDE 0.9 % IV BOLUS
500.0000 mL | Freq: Once | INTRAVENOUS | Status: AC
  Administered 2021-07-01: 500 mL via INTRAVENOUS

## 2021-07-01 MED ORDER — HYDROMORPHONE HCL 1 MG/ML IJ SOLN
1.0000 mg | INTRAMUSCULAR | Status: DC | PRN
  Administered 2021-07-02 – 2021-07-03 (×4): 1 mg via INTRAVENOUS
  Filled 2021-07-01 (×6): qty 1

## 2021-07-01 NOTE — Progress Notes (Signed)
Inpatient Diabetes Program Recommendations ? ?AACE/ADA: New Consensus Statement on Inpatient Glycemic Control (2015) ? ?Target Ranges:  Prepandial:   less than 140 mg/dL ?     Peak postprandial:   less than 180 mg/dL (1-2 hours) ?     Critically ill patients:  140 - 180 mg/dL  ? ?Lab Results  ?Component Value Date  ? GLUCAP 251 (H) 07/01/2021  ? HGBA1C 5.9 (H) 06/22/2021  ? ? ?Review of Glycemic Control ? Latest Reference Range & Units 06/30/21 05:56 06/30/21 10:12 06/30/21 17:09 07/01/21 08:09  ?Glucose-Capillary 70 - 99 mg/dL 107 (H) 125 (H) 272 (H) 251 (H)  ? ?Diabetes history: DM 2 ?Outpatient Diabetes medications: Tresiba 48 units Daily, Victoza 1.8 mg Daily ?Current orders for Inpatient glycemic control:  ?Novolog 0-15 units tid + hs ? ?Decadron 4 mg given yesterday ? ?Inpatient Diabetes Program Recommendations:   ? ?-  may consider Novolog 2-3 units tid meal coverage while steroids are wearing off ? ?Thanks, ? ?Tama Headings RN, MSN, BC-ADM ?Inpatient Diabetes Coordinator ?Team Pager [phone] (8a-5p) ? ? ? ?

## 2021-07-01 NOTE — Progress Notes (Signed)
Mobility Specialist Progress Note: ? ? 07/01/21 1455  ?Mobility  ?Activity Ambulated with assistance in hallway  ?Level of Assistance Minimal assist, patient does 75% or more  ?Assistive Device Front wheel walker  ?RLE Weight Bearing WBAT  ?Distance Ambulated (ft) 116 ft  ?Activity Response Tolerated well  ?$Mobility charge 1 Mobility  ? ?Pt received in bed willing to participate in mobility. Complaints of 7/10 R leg pain. MinA to get legs back into bed. Left in bed with call bell in reach and all needs met.  ? ?Shealyn Sean ?Mobility Specialist ?Primary Phone [phone] ? ?

## 2021-07-01 NOTE — Progress Notes (Signed)
Orthopaedic Trauma Progress Note ? ?SUBJECTIVE: Doing fair this morning.  Noted to have low blood pressure overnight and pain medication was held due to this.  Now having increased pain in the the right knee and distal femur.  Just received some oxycodone about 30 minutes ago and this is starting to help some.  Still rates the pain 8-9/10.  I have added Toradol to assist with pain control.  Was able to get up and mobilize with therapy yesterday following surgery, felt this went well.  No chest pain. No SOB. No nausea/vomiting.  Denies any lightheadedness or dizziness.  No other complaints.  ? ?OBJECTIVE:  ?Vitals:  ? 07/01/21 0448 07/01/21 0707  ?BP: (!) 105/51 (!) 108/55  ?Pulse: 77 78  ?Resp: 18 20  ?Temp: 97.6 ?F (36.4 ?C)   ?SpO2: 92%   ? ? ?General: Resting in bed, no acute distress ?Respiratory: No increased work of breathing.  ?Right lower extremity: Dressing from toes to mid thigh in place, remains clean, dry, intact.  Tenderness about the knee as expected.  Tolerates minimal knee motion currently secondary to pain.  No significant tenderness throughout the lower leg.  No calf tenderness.  No pain with passive stretch of the toes.  Able to wiggle toes on her own.  Endorses sensation throughout extremity. + DP pulse ? ?IMAGING: Stable post op imaging.  ? ?LABS:  ?Results for orders placed or performed during the hospital encounter of 06/30/21 (from the past 24 hour(s))  ?Glucose, capillary     Status: Abnormal  ? Collection Time: 06/30/21 10:12 AM  ?Result Value Ref Range  ? Glucose-Capillary 125 (H) 70 - 99 mg/dL  ? Comment 1 Notify RN   ? Comment 2 Document in Chart   ?CBC     Status: Abnormal  ? Collection Time: 06/30/21  1:53 PM  ?Result Value Ref Range  ? WBC 14.3 (H) 4.0 - 10.5 K/uL  ? RBC 4.45 3.87 - 5.11 MIL/uL  ? Hemoglobin 12.8 12.0 - 15.0 g/dL  ? HCT 41.5 36.0 - 46.0 %  ? MCV 93.3 80.0 - 100.0 fL  ? MCH 28.8 26.0 - 34.0 pg  ? MCHC 30.8 30.0 - 36.0 g/dL  ? RDW 14.2 11.5 - 15.5 %  ? Platelets 298 150  - 400 K/uL  ? nRBC 0.0 0.0 - 0.2 %  ?Creatinine, serum     Status: Abnormal  ? Collection Time: 06/30/21  1:53 PM  ?Result Value Ref Range  ? Creatinine, Ser 1.73 (H) 0.44 - 1.00 mg/dL  ? GFR, Estimated 33 (L) >60 mL/min  ?Glucose, capillary     Status: Abnormal  ? Collection Time: 06/30/21  5:09 PM  ?Result Value Ref Range  ? Glucose-Capillary 272 (H) 70 - 99 mg/dL  ?CBC     Status: Abnormal  ? Collection Time: 07/01/21  2:58 AM  ?Result Value Ref Range  ? WBC 10.0 4.0 - 10.5 K/uL  ? RBC 3.44 (L) 3.87 - 5.11 MIL/uL  ? Hemoglobin 10.4 (L) 12.0 - 15.0 g/dL  ? HCT 32.5 (L) 36.0 - 46.0 %  ? MCV 94.5 80.0 - 100.0 fL  ? MCH 30.2 26.0 - 34.0 pg  ? MCHC 32.0 30.0 - 36.0 g/dL  ? RDW 14.4 11.5 - 15.5 %  ? Platelets 235 150 - 400 K/uL  ? nRBC 0.0 0.0 - 0.2 %  ?Basic metabolic panel     Status: Abnormal  ? Collection Time: 07/01/21  2:58 AM  ?Result Value Ref Range  ?  Sodium 134 (L) 135 - 145 mmol/L  ? Potassium 4.4 3.5 - 5.1 mmol/L  ? Chloride 101 98 - 111 mmol/L  ? CO2 25 22 - 32 mmol/L  ? Glucose, Bld 272 (H) 70 - 99 mg/dL  ? BUN 35 (H) 8 - 23 mg/dL  ? Creatinine, Ser 2.21 (H) 0.44 - 1.00 mg/dL  ? Calcium 8.9 8.9 - 10.3 mg/dL  ? GFR, Estimated 24 (L) >60 mL/min  ? Anion gap 8 5 - 15  ? ? ?ASSESSMENT: SAMNANG PILI is a 66 y.o. female, 1 Day Post-Op s/p REPAIR OF RIGHT DISTAL FEMUR NONUNION ? ?CV/Blood loss: Acute blood loss anemia, Hgb 10.4 this morning.  BP slightly soft, will continue to monitor.  ? ?PLAN: ?Weightbearing: WBAT RLE ?ROM: Okay for unrestricted hip and knee motion as tolerated ?Incisional and dressing care: OK to remove dressings 07/02/2021 and leave open to air with dry gauze PRN  ?Showering: Okay to begin showering with assistance 07/03/2021, incisions may get wet at this point ?Orthopedic device(s): None  ?Pain management:  ?1. Tylenol 1000 mg q 6 hours scheduled ?2. Tizanidine 4 mg q 8 hours PRN ?3. Oxycodone 5-15 mg q 4 hours PRN ?4. Dilaudid 1 mg q 3 hours PRN ?5. Neurontin 800 mg 3 times daily ?VTE  prophylaxis: Aspirin, SCDs ?ID:  Ancef 2gm post op ?Foley/Lines:  No foley, KVO IVFs ?Impediments to Fracture Healing: Vitamin D level 28, will continue on home dose supplementation (50,000 units q. 7 days) ?Dispo: Therapies as tolerated, PT recommending home health.  No OT follow-up needed.  No additional equipment recommendations at this time.  We will plan to see how patient does today in terms of pain control and mobility.  Possible discharge home tomorrow 07/02/2021 if patient is doing well.  We will plan to remove dressing from RLE 07/02/2021.   ? ?D/C recommendations: ?-Oxycodone for pain control ?-Aspirin for DVT prophylaxis ?-Continue home dose Vit D supplementation ? ?Follow - up plan: 2 weeks after discharge for wound check and repeat x-rays ? ? ?Contact information:  Katha Hamming MD, Rushie Nyhan PA-C. After hours and holidays please check [email or web address] for group call information for [facility] ? ? ?Gwinda Passe, PA-C ?[phone] (office) ?[email or web address] ? ? ? ?

## 2021-07-01 NOTE — Progress Notes (Signed)
Occupational Therapy Treatment ?Patient Details ?Name: Rebecca Kemp ?MRN: [id number] ?DOB: 09-Sep-1955 ?Today's Date: 07/01/2021 ? ? ?History of present illness [name] admitted on 06/30/21 due to R distal femur non union, s/p R femur ORIF; of note, initial fall with R femur ORIF 08/2020, has been followed at regular intervals by Dr Doreatha Martin and failed conservative measures including bone stimulator. Other PMH includes HTN, asthma, DM2, GERD, anxiety, neuropathy, obesity. ?  ?OT comments ? Pt with more pain vs yesterday, but able to stand and transfer with RW with min guard assist. Not able to release walker in static standing for ADLs due to R LE pain. Completed seated grooming, UB dressing. Will rely on family to assist with LB ADLs until she is able to manage stairs to shower.   ? ?Recommendations for follow up therapy are one component of a multi-disciplinary discharge planning process, led by the attending physician.  Recommendations may be updated based on patient status, additional functional criteria and insurance authorization. ?   ?Follow Up Recommendations ? No OT follow up  ?  ?Assistance Recommended at Discharge Intermittent Supervision/Assistance  ?Patient can return home with the following ? A little help with walking and/or transfers;A lot of help with bathing/dressing/bathroom;Assistance with cooking/housework;Assist for transportation;Help with stairs or ramp for entrance ?  ?Equipment Recommendations ? None recommended by OT  ?  ?Recommendations for Other Services   ? ?  ?Precautions / Restrictions Precautions ?Precautions: Fall ?Restrictions ?Weight Bearing Restrictions: Yes ?RLE Weight Bearing: Weight bearing as tolerated  ? ? ?  ? ?Mobility Bed Mobility ?Overal bed mobility: Needs Assistance ?Bed Mobility: Supine to Sit ?  ?  ?Supine to sit: Min assist, HOB elevated ?  ?  ?General bed mobility comments: Min assist for R LE ?  ? ?Transfers ?Overall transfer level: Needs  assistance ?Equipment used: Rolling walker (2 wheels) ?Transfers: Sit to/from Stand ?Sit to Stand: Min guard, From elevated surface ?  ?  ?  ?  ?  ?General transfer comment: no physical assist, increased time ?  ?  ?Balance Overall balance assessment: Needs assistance ?  ?Sitting balance-Leahy Scale: Good ?  ?  ?Standing balance support: Reliant on assistive device for balance ?Standing balance-Leahy Scale: Poor ?Standing balance comment: reliant on B UE support ?  ?  ?  ?  ?  ?  ?  ?  ?  ?  ?  ?   ? ?ADL either performed or assessed with clinical judgement  ? ?ADL Overall ADL's : Needs assistance/impaired ?  ?  ?Grooming: Oral care;Brushing hair;Sitting;Set up ?  ?  ?  ?  ?  ?Upper Body Dressing : Set up;Sitting ?  ?Lower Body Dressing: Total assistance;Bed level ?  ?Toilet Transfer: Min guard;Ambulation;BSC/3in1;Rolling walker (2 wheels) ?  ?Toileting- Clothing Manipulation and Hygiene: Minimal assistance;Sit to/from stand ?  ?  ?  ?  ?  ?  ? ?Extremity/Trunk Assessment   ?  ?  ?  ?  ?  ? ?Vision   ?  ?  ?Perception   ?  ?Praxis   ?  ? ?Cognition Arousal/Alertness: Awake/alert ?Behavior During Therapy: [facility] for tasks assessed/performed ?Overall Cognitive Status: Within Functional Limits for tasks assessed ?  ?  ?  ?  ?  ?  ?  ?  ?  ?  ?  ?  ?  ?  ?  ?  ?  ?  ?  ?   ?Exercises   ? ?  ?  Shoulder Instructions   ? ? ?  ?General Comments    ? ? ?Pertinent Vitals/ Pain       Pain Assessment ?Pain Assessment: Faces ?Faces Pain Scale: Hurts even more ?Pain Location: RLE with WB ?Pain Descriptors / Indicators: Discomfort, Grimacing, Guarding ?Pain Intervention(s): Monitored during session, Premedicated before session, Repositioned ? ?Home Living   ?  ?  ?  ?  ?  ?  ?  ?  ?  ?  ?  ?  ?  ?  ?  ?  ?  ?  ? ?  ?Prior Functioning/Environment    ?  ?  ?  ?   ? ?Frequency ? Min 2X/week  ? ? ? ? ?  ?Progress Toward Goals ? ?OT Goals(current goals can now be found in the care plan section) ? Progress towards OT goals: Progressing  toward goals ? ?Acute Rehab OT Goals ?OT Goal Formulation: With patient ?Time For Goal Achievement: 07/14/21 ?Potential to Achieve Goals: Good  ?Plan Discharge plan remains appropriate   ? ?Co-evaluation ? ? ?   ?  ?  ?  ?  ? ?  ?AM-PAC OT "6 Clicks" Daily Activity     ?Outcome Measure ? ? Help from another person eating meals?: None ?Help from another person taking care of personal grooming?: A Little ?Help from another person toileting, which includes using toliet, bedpan, or urinal?: A Little ?Help from another person bathing (including washing, rinsing, drying)?: A Lot ?Help from another person to put on and taking off regular upper body clothing?: A Little ?Help from another person to put on and taking off regular lower body clothing?: Total ?6 Click Score: 16 ? ?  ?End of Session Equipment Utilized During Treatment: Rolling walker (2 wheels) ? ?OT Visit Diagnosis: Unsteadiness on feet (R26.81);Other abnormalities of gait and mobility (R26.89);Pain ?  ?Activity Tolerance Patient tolerated treatment well ?  ?Patient Left in chair;with call bell/phone within reach;with chair alarm set ?  ?Nurse Communication Mobility status ?  ? ?   ? ?Time: [id number] ?OT Time Calculation (min): 35 min ? ?Charges: OT General Charges ?$OT Visit: 1 Visit ?OT Treatments ?$Self Care/Home Management : 23-37 mins ? ?Nestor Lewandowsky, OTR/L ?Acute Rehabilitation Services ?Pager: [phone] ?Office: [phone]  ?Malka So ?07/01/2021, 10:01 AM ?

## 2021-07-01 NOTE — Progress Notes (Signed)
Physical Therapy Treatment ?Patient Details ?Name: Rebecca Kemp ?MRN: [id number] ?DOB: [birth year] ?Today's Date: 07/01/2021 ? ? ?History of Present Illness [name] admitted on 06/30/21 due to R distal femur non union, s/p R femur ORIF; of note, initial fall with R femur ORIF 08/2020, has been followed at regular intervals by Dr Jena Gauss and failed conservative measures including bone stimulator. Other PMH includes HTN, asthma, DM2, GERD, anxiety, neuropathy, obesity. ?  ?PT Comments  ? ? Pt progressing with mobility. Today's session focused on transfer and gait training with RW, as well as seated/standing ADL tasks in bathroom; pt mobilizing with min guard, moving well despite c/o significant RLE pain; motivated to participate. Pt remains limited by generalized weakness, decreased activity tolerance, pain and impaired balance strategies/postural reactions. Pt reports husband able to provide transportation, therefore, recommend OP ortho PT services to maximize functional mobility and independence upon return home. ?   ?Recommendations for follow up therapy are one component of a multi-disciplinary discharge planning process, led by the attending physician.  Recommendations may be updated based on patient status, additional functional criteria and insurance authorization. ? ?Follow Up Recommendations ? Outpatient PT ?  ?  ?Assistance Recommended at Discharge Intermittent Supervision/Assistance  ?Patient can return home with the following A little help with walking and/or transfers;A lot of help with bathing/dressing/bathroom;Assistance with cooking/housework;Assist for transportation;Help with stairs or ramp for entrance ?  ?Equipment Recommendations ? None recommended by PT  ?  ?Recommendations for Other Services   ? ? ?  ?Precautions / Restrictions Precautions ?Precautions: Fall ?Restrictions ?Weight Bearing Restrictions: Yes ?RLE Weight Bearing: Weight bearing as tolerated  ?  ? ?Mobility ? Bed Mobility ?   ?  ?  ?  ?  ?  ?  ?General bed mobility comments: received sitting in recliner ?  ? ?Transfers ?Overall transfer level: Needs assistance ?Equipment used: Rolling walker (2 wheels) ?Transfers: Sit to/from Stand ?Sit to Stand: Min guard, From elevated surface ?  ?  ?  ?  ?  ?General transfer comment: increased time and effort, able to stand from low recliner and BSC (over toilet) to RW without assist; good awareness of hand placement and sequencing ?  ? ?Ambulation/Gait ?Ambulation/Gait assistance: Min guard ?Gait Distance (Feet): 14 Feet (+ 18') ?Assistive device: Rolling walker (2 wheels) ?Gait Pattern/deviations: Step-to pattern, Step-through pattern, Decreased stride length, Antalgic, Decreased weight shift to right ?Gait velocity: Decreased ?  ?  ?General Gait Details: slow, antalgic gait with RW and intermittent min guard for balance; good ability to progress to step-through gait pattern; further distance limited by fatigue after completing multiple ADL tasks in bathroom ? ? ?Stairs ?  ?  ?  ?  ?  ? ? ?Wheelchair Mobility ?  ? ?Modified Rankin (Stroke Patients Only) ?  ? ? ?  ?Balance Overall balance assessment: Needs assistance ?  ?Sitting balance-Leahy Scale: Good ?Sitting balance - Comments: indep with anterior/posterior pericare sitting on toilet ?  ?Standing balance support: Reliant on assistive device for balance ?Standing balance-Leahy Scale: Fair ?Standing balance comment: able to static stand at sink for ADL tasks (wash hands, wash face) without UE support, pt reports leaning L hip against walker to brace ?  ?  ?  ?  ?  ?  ?  ?  ?  ?  ?  ?  ? ?  ?Cognition Arousal/Alertness: Awake/alert ?Behavior During Therapy: [facility] for tasks assessed/performed ?Overall Cognitive Status: Within Functional Limits for tasks assessed ?  ?  ?  ?  ?  ?  ?  ?  ?  ?  ?  ?  ?  ?  ?  ?  ?  ?  ?  ? ?  ?  Exercises General Exercises - Lower Extremity ?Long Arc Quad: AROM, Right, Seated ?Heel Slides: AROM, Right, Seated ?Hip  Flexion/Marching: AROM, Right, Seated (partial range with LAQ) ? ?  ?General Comments   ?  ?  ? ?Pertinent Vitals/Pain Pain Assessment ?Pain Assessment: Faces ?Faces Pain Scale: Hurts even more ?Pain Location: RLE with WB ?Pain Descriptors / Indicators: Discomfort, Grimacing, Guarding ?Pain Intervention(s): Monitored during session, Limited activity within patient's tolerance  ? ? ?Home Living   ?  ?  ?  ?  ?  ?  ?  ?  ?  ?   ?  ?Prior Function    ?  ?  ?   ? ?PT Goals (current goals can now be found in the care plan section) Progress towards PT goals: Progressing toward goals ? ?  ?Frequency ? ? ? Min 5X/week ? ? ? ?  ?PT Plan Discharge plan needs to be updated  ? ? ?Co-evaluation   ?  ?  ?  ?  ? ?  ?AM-PAC PT "6 Clicks" Mobility   ?Outcome Measure ? Help needed turning from your back to your side while in a flat bed without using bedrails?: A Little ?Help needed moving from lying on your back to sitting on the side of a flat bed without using bedrails?: A Little ?Help needed moving to and from a bed to a chair (including a wheelchair)?: A Little ?Help needed standing up from a chair using your arms (e.g., wheelchair or bedside chair)?: A Little ?Help needed to walk in hospital room?: A Little ?Help needed climbing 3-5 steps with a railing? : A Lot ?6 Click Score: 17 ? ?  ?End of Session   ?Activity Tolerance: Patient tolerated treatment well ?Patient left: in chair;with call bell/phone within reach;with chair alarm set ?Nurse Communication: Mobility status ?PT Visit Diagnosis: Other abnormalities of gait and mobility (R26.89);Muscle weakness (generalized) (M62.81);Pain ?Pain - Right/Left: Right ?Pain - part of body: Leg ?  ? ? ?Time: 1287-8676 ?PT Time Calculation (min) (ACUTE ONLY): 32 min ? ?Charges:  $Gait Training: 8-22 mins ?$Therapeutic Activity: 8-22 mins          ?          ? ?Ina Homes, PT, DPT ?Acute Rehabilitation Services  ?Pager [phone] ?Office [phone] ? ?Rebecca Kemp ?07/01/2021,  11:40 AM ? ?

## 2021-07-01 NOTE — TOC Initial Note (Addendum)
Transition of Care (TOC) - Initial/Assessment Note  ? ? ?Patient Details  ?Name: Rebecca Kemp ?MRN: [id number] ?Date of Birth: 11-02-1955 ? ?Transition of Care (TOC) CM/SW Contact:    ?Kingsley Plan, RN ?Phone Number: ?07/01/2021, 10:57 AM ? ?Clinical Narrative:                 ?Patient from home with husband . Plans to stay with daughter at discharge [address], [place], [place] [address] [name] [phone] at discharge.  ? ?PT recommending home health PT.  ? ?NCM spoke with patient with PT present. Patient prefers OP PT post discharge. PT in agreement. NCM secure chatted Maralyn Sago PA , she is also in agreement. ? ? ?PAtient has transportation to OP PT. Patient has DME.  ? ?After patient is finished working with PT, NCM will provide list of OP PT clinics in [place]  ? ?Patient has decided on [facility] location phone [phone] . Information placed on AVS. Script on chart for provider signature, will faxed when signed. Provider aware  ? ?Expected Discharge Plan: Home/Self Care ?Barriers to Discharge: Continued Medical Work up ? ? ?Patient Goals and CMS Choice ?Patient states their goals for this hospitalization and ongoing recovery are:: to return to home ?CMS Medicare.gov Compare Post Acute Care list provided to:: Patient ?Choice offered to / list presented to : Patient ? ?Expected Discharge Plan and Services ?Expected Discharge Plan: Home/Self Care ?  ?Discharge Planning Services: CM Consult ?Post Acute Care Choice:  (OP PT) ?Living arrangements for the past 2 months: Mobile Home ?                ?DME Arranged: N/A ?  ?  ?  ?  ?HH Arranged: NA ?  ?  ?  ?  ? ?Prior Living Arrangements/Services ?Living arrangements for the past 2 months: Mobile Home ?Lives with:: Spouse ?Patient language and need for interpreter reviewed:: Yes ?Do you feel safe going back to the place where you live?: Yes      ?Need for Family Participation in Patient Care: Yes (Comment) ?Care giver support  system in place?: Yes (comment) ?Current home services: DME ?Criminal Activity/Legal Involvement Pertinent to Current Situation/Hospitalization: No - Comment as needed ? ?Activities of Daily Living ?  ?  ? ?Permission Sought/Granted ?  ?Permission granted to share information with : No ?   ?   ?   ?   ? ?Emotional Assessment ?Appearance:: Appears stated age ?Attitude/Demeanor/Rapport: Engaged ?Affect (typically observed): Accepting ?Orientation: : Oriented to Self, Oriented to Place, Oriented to  Time, Oriented to Situation ?Alcohol / Substance Use: Not Applicable ?Psych Involvement: No (comment) ? 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 Diagnosis Date Noted  ? 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 Femur fracture (HCC) 09/02/2020  ? Acute respiratory failure (HCC) 09/02/2020  ? Encephalopathy 09/02/2020  ? Essential hypertension 09/02/2020  ? Type 2 diabetes mellitus (HCC) 09/02/2020  ? ?PCP:  Hadley Pen, MD ?Pharmacy:   ?[facility] [place], [place] - [address] ?[address] ?[place] [address] ?Phone: [phone] Fax: [phone] ? ? ? ? ?Social Determinants of Health (SDOH) Interventions ?  ? ?Readmission Risk Interventions ?   ? View : No data to  display.  ?  ?  ?  ? ? ? ?

## 2021-07-01 NOTE — Progress Notes (Signed)
Spoke with patient's nurse regarding patient's hypotension: 87/53, mildy tachycardia 102. She is asymptomatic. Had similar episode last night was thought to be related to patient's pain meds. Dc'd oxy 15 for now. 500cc NS bolus ordered. Nurse to hold hydralazine that is due tonight.  ?

## 2021-07-01 NOTE — Plan of Care (Signed)
  Problem: Nutrition: Goal: Adequate nutrition will be maintained Outcome: Progressing   Problem: Safety: Goal: Ability to remain free from injury will improve Outcome: Progressing   

## 2021-07-02 ENCOUNTER — Encounter (HOSPITAL_COMMUNITY): Payer: Self-pay | Admitting: Student

## 2021-07-02 ENCOUNTER — Other Ambulatory Visit (HOSPITAL_COMMUNITY): Payer: Self-pay

## 2021-07-02 LAB — CBC
HCT: 33.3 % — ABNORMAL LOW (ref 36.0–46.0)
Hemoglobin: 10.3 g/dL — ABNORMAL LOW (ref 12.0–15.0)
MCH: 29.6 pg (ref 26.0–34.0)
MCHC: 30.9 g/dL (ref 30.0–36.0)
MCV: 95.7 fL (ref 80.0–100.0)
Platelets: 221 10*3/uL (ref 150–400)
RBC: 3.48 MIL/uL — ABNORMAL LOW (ref 3.87–5.11)
RDW: 14.4 % (ref 11.5–15.5)
WBC: 9.8 10*3/uL (ref 4.0–10.5)
nRBC: 0 % (ref 0.0–0.2)

## 2021-07-02 LAB — GLUCOSE, CAPILLARY
Glucose-Capillary: 179 mg/dL — ABNORMAL HIGH (ref 70–99)
Glucose-Capillary: 164 mg/dL — ABNORMAL HIGH (ref 70–99)
Glucose-Capillary: 137 mg/dL — ABNORMAL HIGH (ref 70–99)
Glucose-Capillary: 126 mg/dL — ABNORMAL HIGH (ref 70–99)

## 2021-07-02 MED ORDER — OXYCODONE HCL 15 MG PO TABS: 15.0000 mg | ORAL_TABLET | ORAL | 0 refills | Status: AC | PRN

## 2021-07-02 MED ORDER — OXYCODONE HCL 15 MG PO TABS
15.0000 mg | ORAL_TABLET | ORAL | 0 refills | Status: DC | PRN
Start: 2021-07-02 — End: 2021-07-02
  Filled 2021-07-02: qty 42, 7d supply, fill #0

## 2021-07-02 MED ORDER — OXYCODONE HCL 5 MG PO TABS
10.0000 mg | ORAL_TABLET | ORAL | Status: DC | PRN
  Administered 2021-07-03 – 2021-07-04 (×3): 10 mg via ORAL
  Administered 2021-07-04: 15 mg via ORAL
  Filled 2021-07-02: qty 2
  Filled 2021-07-02: qty 3
  Filled 2021-07-02 (×2): qty 2

## 2021-07-02 MED ORDER — ASPIRIN 325 MG PO TBEC
325.0000 mg | DELAYED_RELEASE_TABLET | Freq: Two times a day (BID) | ORAL | 0 refills | Status: DC
  Filled 2021-07-02: qty 60, 30d supply, fill #0

## 2021-07-02 MED ORDER — KETOROLAC TROMETHAMINE 15 MG/ML IJ SOLN
15.0000 mg | Freq: Four times a day (QID) | INTRAMUSCULAR | Status: AC
  Administered 2021-07-02 – 2021-07-03 (×5): 15 mg via INTRAVENOUS
  Filled 2021-07-02 (×5): qty 1

## 2021-07-02 NOTE — Progress Notes (Signed)
Orthopaedic Trauma Progress Note ? ?SUBJECTIVE: Still having a lot of pain in the leg. BP medication narcotic pain medications were held last night due to hypotension.  Blood pressure doing better this morning (123/69). Patient remains asymptomatic.  She denies dizziness or lightheadedness.  No chest pain.  No shortness of breath. ? ?OBJECTIVE:  ?Vitals:  ? 07/02/21 0500 07/02/21 0824  ?BP: 123/69 (!) 115/55  ?Pulse: 72 78  ?Resp: 19 18  ?Temp: 98 ?F (36.7 ?C) 98 ?F (36.7 ?C)  ?SpO2: 91% 92%  ? ? ?General: Sitting up on edge of bed, legs resting on the floor.  In no acute distress ?Respiratory: No increased work of breathing.  ?Right lower extremity: Dressing removed.  Swelling throughout the extremity slightly improved from baseline.  Incision with small amount of serosanguineous drainage from the proximal portion, I have reinforced this with Dermabond.  No additional drainage noted.  Tenderness about the knee as expected.  Tolerates gentle knee range of motion.  No calf tenderness.  No pain with passive stretch of the toes.  Able to wiggle toes on her own.  Endorses sensation throughout extremity. + DP pulse ? ?IMAGING: Stable post op imaging.  ? ?LABS:  ?Results for orders placed or performed during the hospital encounter of 06/30/21 (from the past 24 hour(s))  ?Glucose, capillary     Status: Abnormal  ? Collection Time: 07/01/21  5:12 PM  ?Result Value Ref Range  ? Glucose-Capillary 148 (H) 70 - 99 mg/dL  ?Glucose, capillary     Status: Abnormal  ? Collection Time: 07/01/21 10:14 PM  ?Result Value Ref Range  ? Glucose-Capillary 160 (H) 70 - 99 mg/dL  ?CBC     Status: Abnormal  ? Collection Time: 07/02/21 12:42 AM  ?Result Value Ref Range  ? WBC 9.8 4.0 - 10.5 K/uL  ? RBC 3.48 (L) 3.87 - 5.11 MIL/uL  ? Hemoglobin 10.3 (L) 12.0 - 15.0 g/dL  ? HCT 33.3 (L) 36.0 - 46.0 %  ? MCV 95.7 80.0 - 100.0 fL  ? MCH 29.6 26.0 - 34.0 pg  ? MCHC 30.9 30.0 - 36.0 g/dL  ? RDW 14.4 11.5 - 15.5 %  ? Platelets 221 150 - 400 K/uL  ?  nRBC 0.0 0.0 - 0.2 %  ?Glucose, capillary     Status: Abnormal  ? Collection Time: 07/02/21  8:22 AM  ?Result Value Ref Range  ? Glucose-Capillary 179 (H) 70 - 99 mg/dL  ?Glucose, capillary     Status: Abnormal  ? Collection Time: 07/02/21 11:38 AM  ?Result Value Ref Range  ? Glucose-Capillary 164 (H) 70 - 99 mg/dL  ? ? ?ASSESSMENT: Rebecca Kemp is a 66 y.o. female, 2 Days Post-Op s/p REPAIR OF RIGHT DISTAL FEMUR NONUNION ? ?CV/Blood loss: Acute blood loss anemia, Hgb 10.3 this morning.  Hemodynamically stable ? ?PLAN: ?Weightbearing: WBAT RLE ?ROM: Okay for unrestricted hip and knee motion as tolerated ?Incisional and dressing care: Okay to leave incisions open to air  ?Showering: Okay to begin showering with assistance 07/03/2021, incisions may get wet at this point ?Orthopedic device(s): None  ?Pain management:  ?1. Tylenol 1000 mg q 6 hours scheduled ?2. Tizanidine 4 mg q 8 hours PRN ?3. Oxycodone 5-15 mg q 4 hours PRN ?4. Dilaudid 1 mg q 3 hours PRN ?5. Neurontin 800 mg 3 times daily ?VTE prophylaxis: Lovenox, SCDs ?ID:  Ancef 2gm post op completed ?Foley/Lines:  No foley, KVO IVFs ?Impediments to Fracture Healing: Vitamin D level 28, will  continue on home dose supplementation (50,000 units q. 7 days) ?Dispo: Therapies as tolerated, PT recommending home health.  No OT follow-up needed.  No additional equipment recommendations at this time.  Continue to work on pain control today and monitor BP.  Plan for discharge home tomorrow 07/03/2021.  I will plan to send discharge Rx to [facility] pharmacy ? ?D/C recommendations: ?-Oxycodone for pain control ?-Aspirin for DVT prophylaxis ?-Continue home dose Vit D supplementation ? ?Follow - up plan: 2 weeks after discharge for wound check and repeat x-rays ? ? ?Contact information:  Katha Hamming MD, Rushie Nyhan PA-C. After hours and holidays please check [email or web address] for group call information for [facility] ? ? ?Gwinda Passe, PA-C ?([phone]  (office) ?[email or web address] ? ? ? ?

## 2021-07-02 NOTE — Anesthesia Postprocedure Evaluation (Signed)
Anesthesia Post Note ? ?Patient: Rebecca Kemp ? ?Procedure(s) Performed: REPAIR OF RIGHT DISTAL FEMUR NONUNION (Right: Leg Upper) ? ?  ? ?Patient location during evaluation: PACU ?Anesthesia Type: General ?Level of consciousness: awake and alert ?Pain management: pain level controlled ?Vital Signs Assessment: post-procedure vital signs reviewed and stable ?Respiratory status: spontaneous breathing, nonlabored ventilation, respiratory function stable and patient connected to nasal cannula oxygen ?Cardiovascular status: blood pressure returned to baseline and stable ?Postop Assessment: no apparent nausea or vomiting ?Anesthetic complications: no ? ? ?No notable events documented. ? ?Last Vitals:  ?Vitals:  ? 07/02/21 0824 07/02/21 2005  ?BP: (!) 115/55 (!) 98/50  ?Pulse: 78 72  ?Resp: 18 20  ?Temp: 36.7 ?C 36.8 ?C  ?SpO2: 92% 91%  ?  ?Last Pain:  ?Vitals:  ? 07/02/21 2005  ?TempSrc: Oral  ?PainSc:   ? ? ?  ?  ?  ?  ?  ?  ? ?Marcene Duos E ? ? ? ? ?

## 2021-07-02 NOTE — Progress Notes (Signed)
Physical Therapy Treatment ?Patient Details ?Name: Rebecca Kemp ?MRN: [id number] ?DOB: 07-Apr-1955 ?Today's Date: 07/02/2021 ? ? ?History of Present Illness [name] admitted on 06/30/21 due to R distal femur non union, s/p R femur ORIF; of note, initial fall with R femur ORIF 08/2020, has been followed at regular intervals by Dr Jena Gauss and failed conservative measures including bone stimulator. Other PMH includes HTN, asthma, DM2, GERD, anxiety, neuropathy, obesity. ? ?  ?PT Comments  ? ? Pt received supine and agreeable to session with progress towards goals. Pt min a for bed mobility for RLE management and min guard for transfers x3 throughout session, no physical assist needed. Pt min guard throughout ambulation in room with distance limited secondary to increased pain. Pt able to ascend/descend curb in room as pt stating steps at daughters house are deep enough to accommodate walker. Min assist needed for cueing for sequencing of RW and LE, and pt without LOB and fair power to ascend. Pt continues to benefit from skilled PT services to progress toward functional mobility goals.  ? ?  ?Recommendations for follow up therapy are one component of a multi-disciplinary discharge planning process, led by the attending physician.  Recommendations may be updated based on patient status, additional functional criteria and insurance authorization. ? ?Follow Up Recommendations ? Outpatient PT ?  ?  ?Assistance Recommended at Discharge Intermittent Supervision/Assistance  ?Patient can return home with the following A little help with walking and/or transfers;A lot of help with bathing/dressing/bathroom;Assistance with cooking/housework;Assist for transportation;Help with stairs or ramp for entrance ?  ?Equipment Recommendations ? None recommended by PT  ?  ?Recommendations for Other Services   ? ? ?  ?Precautions / Restrictions Precautions ?Precautions: Fall ?Restrictions ?Weight Bearing Restrictions: Yes ?RLE  Weight Bearing: Weight bearing as tolerated  ?  ? ?Mobility ? Bed Mobility ?Overal bed mobility: Needs Assistance ?Bed Mobility: Supine to Sit ?  ?  ?Supine to sit: Min assist, HOB elevated ?  ?  ?General bed mobility comments: Min assist for R LE ?  ? ?Transfers ?Overall transfer level: Needs assistance ?Equipment used: Rolling walker (2 wheels) ?Transfers: Sit to/from Stand ?Sit to Stand: Min guard, From elevated surface ?  ?  ?  ?  ?  ?General transfer comment: no physical assist, increased time ?  ? ?Ambulation/Gait ?Ambulation/Gait assistance: Min guard ?Gait Distance (Feet): 45 Feet ?Assistive device: Rolling walker (2 wheels) ?Gait Pattern/deviations: Step-to pattern, Step-through pattern, Decreased stride length, Antalgic, Decreased weight shift to right ?Gait velocity: Decreased ?  ?  ?General Gait Details: slow, antalgic gait with RW and intermittent min guard for balance;  further distance limited by fatigue after up and over curb step multiple times ? ? ?Stairs ?Stairs: Yes ?Stairs assistance: Min assist, Min guard (asssit for cues) ?Stair Management: No rails, Forwards, With walker ?Number of Stairs: 2 ?General stair comments: up and over curb x2 ? ? ?Wheelchair Mobility ?  ? ?Modified Rankin (Stroke Patients Only) ?  ? ? ?  ?Balance Overall balance assessment: Needs assistance ?  ?Sitting balance-Leahy Scale: Good ?Sitting balance - Comments: indep with anterior/posterior pericare sitting on toilet ?  ?Standing balance support: Reliant on assistive device for balance ?Standing balance-Leahy Scale: Poor ?Standing balance comment: reliant on B UE support ?  ?  ?  ?  ?  ?  ?  ?  ?  ?  ?  ?  ? ?  ?Cognition Arousal/Alertness: Awake/alert ?Behavior During Therapy: [facility] for tasks  assessed/performed ?Overall Cognitive Status: Within Functional Limits for tasks assessed ?  ?  ?  ?  ?  ?  ?  ?  ?  ?  ?  ?  ?  ?  ?  ?  ?  ?  ?  ? ?  ?Exercises   ? ?  ?General Comments   ?  ?  ? ?Pertinent Vitals/Pain Pain  Assessment ?Pain Assessment: Faces ?Faces Pain Scale: Hurts even more ?Pain Location: RLE with WB ?Pain Descriptors / Indicators: Discomfort, Grimacing, Guarding ?Pain Intervention(s): Limited activity within patient's tolerance, Monitored during session, Repositioned, RN gave pain meds during session  ? ? ?Home Living   ?  ?  ?  ?  ?  ?  ?  ?  ?  ?   ?  ?Prior Function    ?  ?  ?   ? ?PT Goals (current goals can now be found in the care plan section) Acute Rehab PT Goals ?PT Goal Formulation: With patient ?Time For Goal Achievement: 07/14/21 ? ?  ?Frequency ? ? ? Min 5X/week ? ? ? ?  ?PT Plan    ? ? ?Co-evaluation   ?  ?  ?  ?  ? ?  ?AM-PAC PT "6 Clicks" Mobility   ?Outcome Measure ? Help needed turning from your back to your side while in a flat bed without using bedrails?: A Little ?Help needed moving from lying on your back to sitting on the side of a flat bed without using bedrails?: A Little ?Help needed moving to and from a bed to a chair (including a wheelchair)?: A Little ?Help needed standing up from a chair using your arms (e.g., wheelchair or bedside chair)?: A Little ?Help needed to walk in hospital room?: A Little ?Help needed climbing 3-5 steps with a railing? : A Little ?6 Click Score: 18 ? ?  ?End of Session Equipment Utilized During Treatment: Gait belt ?Activity Tolerance: Patient tolerated treatment well ?Patient left: in bed;with call bell/phone within reach ?Nurse Communication: Mobility status ?PT Visit Diagnosis: Other abnormalities of gait and mobility (R26.89);Muscle weakness (generalized) (M62.81);Pain ?Pain - Right/Left: Right ?Pain - part of body: Leg ?  ? ? ?Time: 7371-0626 ?PT Time Calculation (min) (ACUTE ONLY): 30 min ? ?Charges:  $Gait Training: 8-22 mins ?$Therapeutic Activity: 8-22 mins          ?          ? ?Lenora Boys. PTA ?Acute Rehabilitation Services ?Office: [phone] ? ? ? ?Marlana Salvage Rupert Azzara ?07/02/2021, 10:56 AM ? ?

## 2021-07-02 NOTE — Discharge Instructions (Signed)
? ?Orthopaedic Trauma Service Discharge Instructions ? ? ?General Discharge Instructions ? ?WEIGHT BEARING STATUS: Weightbearing as tolerated  ? ?RANGE OF MOTION/ACTIVITY:Ok for knee range of motion as tolerated  ? ?Wound Care: Incisions can be left open to air if there is no drainage. If incision continues to have drainage, follow wound care instructions below. Okay to shower if no drainage from incisions. ? ?DVT/PE prophylaxis: Aspirin 325 mg twice daily x 30 days ? ?Diet: as you were eating previously.  Can use over the counter stool softeners and bowel preparations, such as Miralax, to help with bowel movements.  Narcotics can be constipating.  Be sure to drink plenty of fluids ? ?PAIN MEDICATION USE AND EXPECTATIONS ? You have likely been given narcotic medications to help control your pain.  After a traumatic event that results in an fracture (broken bone) with or without surgery, it is ok to use narcotic pain medications to help control one's pain.  We understand that everyone responds to pain differently and each individual patient will be evaluated on a regular basis for the continued need for narcotic medications. Ideally, narcotic medication use should last no more than 6-8 weeks (coinciding with fracture healing).  ? As a patient it is your responsibility as well to monitor narcotic medication use and report the amount and frequency you use these medications when you come to your office visit.  ? We would also advise that if you are using narcotic medications, you should take a dose prior to therapy to maximize you participation. ? ?IF YOU ARE ON NARCOTIC MEDICATIONS IT IS NOT PERMISSIBLE TO OPERATE A MOTOR VEHICLE (MOTORCYCLE/CAR/TRUCK/MOPED) OR HEAVY MACHINERY ?DO NOT MIX NARCOTICS WITH OTHER CNS ([place]) DEPRESSANTS SUCH AS ALCOHOL ? ? ?STOP SMOKING OR USING NICOTINE PRODUCTS!!!! ? As discussed nicotine severely impairs your body's ability to heal surgical and traumatic wounds but  also impairs bone healing.  Wounds and bone heal by forming microscopic blood vessels (angiogenesis) and nicotine is a vasoconstrictor (essentially, shrinks blood vessels).  Therefore, if vasoconstriction occurs to these microscopic blood vessels they essentially disappear and are unable to deliver necessary nutrients to the healing tissue.  This is one modifiable factor that you can do to dramatically increase your chances of healing your injury.   ? (This means no smoking, no nicotine gum, patches, etc) ? ?DO NOT USE NONSTEROIDAL ANTI-INFLAMMATORY DRUGS (NSAID'S) ? Using products such as Advil (ibuprofen), Aleve (naproxen), Motrin (ibuprofen) for additional pain control during fracture healing can delay and/or prevent the healing response.  If you would like to take over the counter (OTC) medication, Tylenol (acetaminophen) is ok.  However, some narcotic medications that are given for pain control contain acetaminophen as well. Therefore, you should not exceed more than 4000 mg of tylenol in a day if you do not have liver disease.  Also note that there are may OTC medicines, such as cold medicines and allergy medicines that my contain tylenol as well.  If you have any questions about medications and/or interactions please ask your doctor/PA or your pharmacist.  ?   ? ?ICE AND ELEVATE INJURED/OPERATIVE EXTREMITY ? Using ice and elevating the injured extremity above your heart can help with swelling and pain control.  Icing in a pulsatile fashion, such as 20 minutes on and 20 minutes off, can be followed.   ? Do not place ice directly on skin. Make sure there is a barrier between to skin and the ice pack.   ? Using frozen items  such as frozen peas works well as the conform nicely to the are that needs to be iced. ? ?USE AN ACE WRAP OR TED HOSE FOR SWELLING CONTROL ? In addition to icing and elevation, Ace wraps or TED hose are used to help limit and resolve swelling.  It is recommended to use Ace wraps or TED hose  until you are informed to stop.   ? When using Ace Wraps start the wrapping distally (farthest away from the body) and wrap proximally (closer to the body) ?  Example: If you had surgery on your leg or thing and you do not have a splint on, start the ace wrap at the toes and work your way up to the thigh ?       If you had surgery on your upper extremity and do not have a splint on, start the ace wrap at your fingers and work your way up to the upper arm ? ? ?CALL THE OFFICE WITH ANY QUESTIONS OR CONCERNS: [phone]  ? ?VISIT OUR WEBSITE FOR ADDITIONAL INFORMATION: [email or web address] ?  ?Discharge Wound Care Instructions ? ?Do NOT apply any ointments, solutions or lotions to pin sites or surgical wounds.  These prevent needed drainage and even though solutions like hydrogen peroxide kill bacteria, they also damage cells lining the pin sites that help fight infection.  Applying lotions or ointments can keep the wounds moist and can cause them to breakdown and open up as well. This can increase the risk for infection. When in doubt call the office. ? ?If any drainage is noted, use one layer of adaptic or Mepitel, then gauze, Kerlix, and an ace wrap. ?- These dressing supplies should be available at local medical supply stores [facility], [facility], etc) as well as Management consultant ([facility], [facility], Paediatric nurse, etc) ? ?Once the incision is completely dry and without drainage, it may be left open to air out.  Showering may begin 36-48 hours later.  Cleaning gently with soap and water. ?

## 2021-07-02 NOTE — TOC Progression Note (Signed)
Transition of Care (TOC) - Progression Note  ? ? ?Patient Details  ?Name: Rebecca Kemp ?MRN: [id number] ?Date of Birth: 09-04-1955 ? ?Transition of Care (TOC) CM/SW Contact  ?Kingsley Plan, RN ?Phone Number: ?07/02/2021, 3:01 PM ? ?Clinical Narrative:    ? ? Prescription for OP PT faxed to [facility] location phone [phone] . Fax [phone] Information placed on AVS.  ?Expected Discharge Plan: Home/Self Care ?Barriers to Discharge: Continued Medical Work up ? ?Expected Discharge Plan and Services ?Expected Discharge Plan: Home/Self Care ?  ?Discharge Planning Services: CM Consult ?Post Acute Care Choice:  (OP PT) ?Living arrangements for the past 2 months: Mobile Home ?                ?DME Arranged: N/A ?  ?  ?  ?  ?HH Arranged: NA ?  ?  ?  ?  ? ? ?Social Determinants of Health (SDOH) Interventions ?  ? ?Readmission Risk Interventions ?   ? View : No data to display.  ?  ?  ?  ? ? ?

## 2021-07-02 NOTE — Plan of Care (Signed)
?  Problem: Nutrition: ?Goal: Adequate nutrition will be maintained ?Outcome: Progressing ?  ?Problem: Elimination: ?Goal: Will not experience complications related to bowel motility ?Outcome: Progressing ?  ?Problem: Safety: ?Goal: Ability to remain free from injury will improve ?Outcome: Progressing ?  ?Problem: Pain Managment: ?Goal: General experience of comfort will improve ?Outcome: Not Progressing ?  ?

## 2021-07-02 NOTE — Progress Notes (Signed)
Mobility Specialist Progress Note: ? ? 07/02/21 1609  ?Mobility  ?Activity Ambulated with assistance in room;Ambulated with assistance to bathroom  ?Level of Assistance Standby assist, set-up cues, supervision of patient - no hands on  ?Assistive Device Front wheel walker  ?RLE Weight Bearing WBAT  ?Distance Ambulated (ft) 20 ft  ?Activity Response Tolerated well  ?$Mobility charge 1 Mobility  ? ?Pt received in bed willing to participate in mobility. Complaints of R leg pain. Left in bed with call bell in reach and all needs met.  ? ?Rebecca Kemp ?Mobility Specialist ?Primary Phone [phone] ? ?

## 2021-07-02 NOTE — Progress Notes (Signed)
Occupational Therapy Treatment ?Patient Details ?Name: Rebecca Kemp ?MRN: [id number] ?DOB: [birth year] ?Today's Date: 07/02/2021 ? ? ?History of present illness [name] admitted on 06/30/21 due to R distal femur non union, s/p R femur ORIF; of note, initial fall with R femur ORIF 08/2020, has been followed at regular intervals by Dr Rebecca Kemp and failed conservative measures including bone stimulator. Other PMH includes HTN, asthma, DM2, GERD, anxiety, neuropathy, obesity. ?  ?OT comments ? Pt is routinely walking with RW to bathroom with min guard assist. Educated pt in compensatory strategies for LB ADLs and use of AE, but familiar with use from previous injury. Min assist of R LE back into bed.   ? ?Recommendations for follow up therapy are one component of a multi-disciplinary discharge planning process, led by the attending physician.  Recommendations may be updated based on patient status, additional functional criteria and insurance authorization. ?   ?Follow Up Recommendations ? No OT follow up  ?  ?Assistance Recommended at Discharge Intermittent Supervision/Assistance  ?Patient can return home with the following ? A little help with walking and/or transfers;A lot of help with bathing/dressing/bathroom;Assistance with cooking/housework;Assist for transportation;Help with stairs or ramp for entrance ?  ?Equipment Recommendations ? None recommended by OT  ?  ?Recommendations for Other Services   ? ?  ?Precautions / Restrictions Precautions ?Precautions: Fall ?Restrictions ?Weight Bearing Restrictions: Yes ?RLE Weight Bearing: Weight bearing as tolerated  ? ? ?  ? ?Mobility Bed Mobility ?Overal bed mobility: Needs Assistance ?Bed Mobility: Sit to Supine ?  ?  ?  ?Sit to supine: Min assist ?  ?General bed mobility comments: Min assist for R LE ?  ? ?Transfers ?Overall transfer level: Needs assistance ?Equipment used: Rolling walker (2 wheels) ?Transfers: Sit to/from Stand ?Sit to Stand: Min guard, From  elevated surface ?  ?  ?  ?  ?  ?General transfer comment: slow to rise ?  ?  ?Balance Overall balance assessment: Needs assistance ?  ?Sitting balance-Leahy Scale: Good ?  ?  ?  ?Standing balance-Leahy Scale: Poor ?  ?  ?  ?  ?  ?  ?  ?  ?  ?  ?  ?  ?   ? ?ADL either performed or assessed with clinical judgement  ? ?ADL Overall ADL's : Needs assistance/impaired ?  ?  ?  ?  ?  ?  ?  ?Lower Body Bathing Details (indicate cue type and reason): educated pt in use of long handled bath sponge and reacher to wash and dry LEs ?  ?  ?  ?Lower Body Dressing Details (indicate cue type and reason): educated pt in dressing R LE first using reacher, pt does not intend to wear socks ?Toilet Transfer: Min guard;Ambulation;BSC/3in1;Rolling walker (2 wheels) ?  ?Toileting- Clothing Manipulation and Hygiene: Sit to/from stand;Min guard ?  ?  ?  ?Functional mobility during ADLs: Min guard;Rolling walker (2 wheels) ?  ?  ? ?Extremity/Trunk Assessment   ?  ?  ?  ?  ?  ? ?Vision   ?  ?  ?Perception   ?  ?Praxis   ?  ? ?Cognition Arousal/Alertness: Awake/alert ?Behavior During Therapy: [facility] for tasks assessed/performed ?Overall Cognitive Status: Within Functional Limits for tasks assessed ?  ?  ?  ?  ?  ?  ?  ?  ?  ?  ?  ?  ?  ?  ?  ?  ?  ?  ?  ?   ?  Exercises   ? ?  ?Shoulder Instructions   ? ? ?  ?General Comments    ? ? ?Pertinent Vitals/ Pain       Pain Assessment ?Pain Assessment: Faces ?Faces Pain Scale: Hurts even more ?Pain Location: RLE with WB ?Pain Descriptors / Indicators: Discomfort, Grimacing, Guarding ?Pain Intervention(s): RN gave pain meds during session, Repositioned ? ?Home Living   ?  ?  ?  ?  ?  ?  ?  ?  ?  ?  ?  ?  ?  ?  ?  ?  ?  ?  ? ?  ?Prior Functioning/Environment    ?  ?  ?  ?   ? ?Frequency ? Min 2X/week  ? ? ? ? ?  ?Progress Toward Goals ? ?OT Goals(current goals can now be found in the care plan section) ? Progress towards OT goals: Progressing toward goals ? ?Acute Rehab OT Goals ?OT Goal Formulation: With  patient ?Time For Goal Achievement: 07/14/21 ?Potential to Achieve Goals: Good  ?Plan Discharge plan remains appropriate   ? ?Co-evaluation ? ? ?   ?  ?  ?  ?  ? ?  ?AM-PAC OT "6 Clicks" Daily Activity     ?Outcome Measure ? ? Help from another person eating meals?: None ?Help from another person taking care of personal grooming?: A Little ?Help from another person toileting, which includes using toliet, bedpan, or urinal?: A Little ?Help from another person bathing (including washing, rinsing, drying)?: A Little ?Help from another person to put on and taking off regular upper body clothing?: A Little ?Help from another person to put on and taking off regular lower body clothing?: A Little ?6 Click Score: 19 ? ?  ?End of Session Equipment Utilized During Treatment: Rolling walker (2 wheels) ? ?OT Visit Diagnosis: Unsteadiness on feet (R26.81);Other abnormalities of gait and mobility (R26.89);Pain ?Pain - Right/Left: Right ?Pain - part of body: Leg ?  ?Activity Tolerance Patient tolerated treatment well ?  ?Patient Left in bed;with call bell/phone within reach;with family/visitor present ?  ?Nurse Communication   ?  ? ?   ? ?Time: 1610-9604 ?OT Time Calculation (min): 16 min ? ?Charges: OT General Charges ?$OT Visit: 1 Visit ?OT Treatments ?$Self Care/Home Management : 8-22 mins ? ?Rebecca Kemp, OTR/L ?Acute Rehabilitation Services ?Pager: [phone] ?Office: [phone]  ? ?Rebecca Kemp ?07/02/2021, 11:47 AM ?

## 2021-07-03 LAB — BASIC METABOLIC PANEL
Anion gap: 8 (ref 5–15)
BUN: 53 mg/dL — ABNORMAL HIGH (ref 8–23)
CO2: 21 mmol/L — ABNORMAL LOW (ref 22–32)
Calcium: 8.9 mg/dL (ref 8.9–10.3)
Chloride: 104 mmol/L (ref 98–111)
Creatinine, Ser: 2.12 mg/dL — ABNORMAL HIGH (ref 0.44–1.00)
GFR, Estimated: 26 mL/min — ABNORMAL LOW (ref >60)
Glucose, Bld: 129 mg/dL — ABNORMAL HIGH (ref 70–99)
Potassium: 4.9 mmol/L (ref 3.5–5.1)
Sodium: 133 mmol/L — ABNORMAL LOW (ref 135–145)

## 2021-07-03 LAB — CBC
HCT: 30.9 % — ABNORMAL LOW (ref 36.0–46.0)
Hemoglobin: 9.9 g/dL — ABNORMAL LOW (ref 12.0–15.0)
MCH: 30.2 pg (ref 26.0–34.0)
MCHC: 32 g/dL (ref 30.0–36.0)
MCV: 94.2 fL (ref 80.0–100.0)
Platelets: 234 10*3/uL (ref 150–400)
RBC: 3.28 MIL/uL — ABNORMAL LOW (ref 3.87–5.11)
RDW: 14.3 % (ref 11.5–15.5)
WBC: 9.7 10*3/uL (ref 4.0–10.5)
nRBC: 0 % (ref 0.0–0.2)

## 2021-07-03 LAB — GLUCOSE, CAPILLARY
Glucose-Capillary: 107 mg/dL — ABNORMAL HIGH (ref 70–99)
Glucose-Capillary: 119 mg/dL — ABNORMAL HIGH (ref 70–99)
Glucose-Capillary: 135 mg/dL — ABNORMAL HIGH (ref 70–99)
Glucose-Capillary: 129 mg/dL — ABNORMAL HIGH (ref 70–99)

## 2021-07-03 MED ORDER — GABAPENTIN 300 MG PO CAPS
600.0000 mg | ORAL_CAPSULE | Freq: Three times a day (TID) | ORAL | Status: DC
  Administered 2021-07-03 – 2021-07-04 (×3): 600 mg via ORAL
  Filled 2021-07-03 (×3): qty 2

## 2021-07-03 MED ORDER — HYDRALAZINE HCL 25 MG PO TABS
25.0000 mg | ORAL_TABLET | Freq: Three times a day (TID) | ORAL | Status: DC
Start: 2021-07-04 — End: 2021-07-04
  Administered 2021-07-04: 25 mg via ORAL
  Filled 2021-07-03: qty 1

## 2021-07-03 MED ORDER — HYDROMORPHONE HCL 1 MG/ML IJ SOLN: 1.0000 mg | INTRAMUSCULAR | Status: DC | PRN

## 2021-07-03 MED ORDER — AMLODIPINE BESYLATE 5 MG PO TABS
5.0000 mg | ORAL_TABLET | Freq: Every day | ORAL | Status: DC
  Administered 2021-07-04: 5 mg via ORAL
  Filled 2021-07-03: qty 1

## 2021-07-03 MED ORDER — HYDROMORPHONE HCL 1 MG/ML IJ SOLN
0.5000 mg | INTRAMUSCULAR | Status: DC | PRN
  Administered 2021-07-04: 0.5 mg via INTRAVENOUS
  Filled 2021-07-03: qty 0.5

## 2021-07-03 NOTE — Progress Notes (Addendum)
Physical Therapy Treatment ?Patient Details ?Name: Rebecca Kemp ?MRN: [id number] ?DOB: 1955-07-03 ?Today's Date: 07/03/2021 ? ? ?History of Present Illness [name] admitted on 06/30/21 due to R distal femur non union, s/p R femur ORIF; of note, initial fall with R femur ORIF 08/2020, has been followed at regular intervals by Dr Jena Gauss and failed conservative measures including bone stimulator. Other PMH includes HTN, asthma, DM2, GERD, anxiety, neuropathy, obesity. ? ?  ?PT Comments  ? ? Pt sitting EOB with PA on arrival to room and requesting assist to restroom. Pt with significantly decreased gait speed to and from restroom secondary to pain, stiffness, and fatigue. Pt with delayed responses and often drifting off mid sentence. Husband reports she did not sleep well last night. LE ther ex deferred as pt was struggling to participate. Issued HEP handout to husband with instructions on how to perform. Will continue to follow acutely for mobility progression.  ?  ?Recommendations for follow up therapy are one component of a multi-disciplinary discharge planning process, led by the attending physician.  Recommendations may be updated based on patient status, additional functional criteria and insurance authorization. ? ?Follow Up Recommendations ? Outpatient PT ?  ?  ?Assistance Recommended at Discharge Intermittent Supervision/Assistance  ?Patient can return home with the following A little help with walking and/or transfers;A lot of help with bathing/dressing/bathroom;Assistance with cooking/housework;Assist for transportation;Help with stairs or ramp for entrance ?  ?Equipment Recommendations ? None recommended by PT  ?  ?Recommendations for Other Services   ? ? ?  ?Precautions / Restrictions Precautions ?Precautions: Fall ?Restrictions ?Weight Bearing Restrictions: Yes ?RLE Weight Bearing: Weight bearing as tolerated  ?  ? ?Mobility ? Bed Mobility ?Overal bed mobility: Needs Assistance ?Bed Mobility:  Sit to Supine ?  ?  ?  ?  ?  ?General bed mobility comments: sitting EOB on arrival ?  ? ?Transfers ?Overall transfer level: Needs assistance ?Equipment used: Rolling walker (2 wheels) ?Transfers: Sit to/from Stand ?Sit to Stand: Min guard, From elevated surface ?  ?  ?  ?  ?  ?General transfer comment: slow to rise, increased time and effort with use of arm rests when rising from [facility]. ?  ? ?Ambulation/Gait ?Ambulation/Gait assistance: Min guard ?Gait Distance (Feet): 20 Feet (20x2) ?Assistive device: Rolling walker (2 wheels) ?Gait Pattern/deviations: Step-to pattern, Decreased stride length, Antalgic, Decreased weight shift to right, Decreased step length - left, Decreased stance time - right ?Gait velocity: Decreased ?  ?  ?General Gait Details: slow, antalgic gait with RW. min guard for balance;  further distance limited by fatigue ? ? ?Stairs ?  ?  ?  ?  ?  ? ? ?Wheelchair Mobility ?  ? ?Modified Rankin (Stroke Patients Only) ?  ? ? ?  ?Balance Overall balance assessment: Needs assistance ?  ?Sitting balance-Leahy Scale: Good ?Sitting balance - Comments: indep with anterior/posterior pericare sitting on toilet ?  ?Standing balance support: Reliant on assistive device for balance ?Standing balance-Leahy Scale: Poor ?Standing balance comment: reliant on B UE support ?  ?  ?  ?  ?  ?  ?  ?  ?  ?  ?  ?  ? ?  ?Cognition Arousal/Alertness: Lethargic, Suspect due to medications ?Behavior During Therapy: Flat affect ?Overall Cognitive Status: Impaired/Different from baseline ?Area of Impairment: Attention, Memory, Following commands, Awareness ?  ?  ?  ?  ?  ?  ?  ?  ?  ?Current Attention Level: Sustained ?  Memory: Decreased recall of precautions, Decreased short-term memory ?Following Commands: Follows one step commands inconsistently, Follows one step commands with increased time ?  ?Awareness: Emergent ?  ?General Comments: Pt very lethargic this session and slow to respond to questions, if at all. Pt would stop mid  sentence and drift off. Husband reports she did not sleep well and RN advises pt just recieved dilaudid ?  ?  ? ?  ?Exercises   ? ?  ?General Comments General comments (skin integrity, edema, etc.): husband present and supportive. Gave HEP handout for pt to perform with husband when she is feeling better. ?  ?  ? ?Pertinent Vitals/Pain Pain Assessment ?Pain Assessment: Faces ?Faces Pain Scale: Hurts even more ?Pain Location: RLE with WB ?Pain Descriptors / Indicators: Discomfort, Grimacing, Guarding  ? ? ?Home Living   ?  ?  ?  ?  ?  ?  ?  ?  ?  ?   ?  ?Prior Function    ?  ?  ?   ? ?PT Goals (current goals can now be found in the care plan section) Acute Rehab PT Goals ?Patient Stated Goal: d/c to daughter's home with assist ?PT Goal Formulation: With patient ?Time For Goal Achievement: 07/14/21 ?Potential to Achieve Goals: Good ?Progress towards PT goals: Progressing toward goals ? ?  ?Frequency ? ? ? Min 5X/week ? ? ? ?  ?PT Plan Current plan remains appropriate  ? ? ?Co-evaluation   ?  ?  ?  ?  ? ?  ?AM-PAC PT "6 Clicks" Mobility   ?Outcome Measure ? Help needed turning from your back to your side while in a flat bed without using bedrails?: A Little ?Help needed moving from lying on your back to sitting on the side of a flat bed without using bedrails?: A Little ?Help needed moving to and from a bed to a chair (including a wheelchair)?: A Little ?Help needed standing up from a chair using your arms (e.g., wheelchair or bedside chair)?: A Little ?Help needed to walk in hospital room?: A Little ?Help needed climbing 3-5 steps with a railing? : A Little ?6 Click Score: 18 ? ?  ?End of Session Equipment Utilized During Treatment: Gait belt ?Activity Tolerance: Patient tolerated treatment well ?Patient left: in bed;with call bell/phone within reach ?Nurse Communication: Mobility status ?PT Visit Diagnosis: Other abnormalities of gait and mobility (R26.89);Muscle weakness (generalized) (M62.81);Pain ?Pain -  Right/Left: Right ?Pain - part of body: Leg ?  ? ? ?Time: 0092-3300 ?PT Time Calculation (min) (ACUTE ONLY): 39 min ? ?Charges:  $Gait Training: 23-37 mins ?$Therapeutic Activity: 8-22 mins          ?          ? ?Kallie Locks, PTA ?Acute Rehab ? ?Sheral Apley ?07/03/2021, 10:20 AM ? ?

## 2021-07-03 NOTE — Progress Notes (Signed)
Discussed with Liane Comber with pharmacy lowering gabapentin dose in the setting of patient's AKI given that she was not feeling well this morning Will plan to lower dose to 600 mg TID (patient is on 800 mg TID at baseline) as to not dose reduce too quickly.  ?Merlene Pulling, PA-C ?

## 2021-07-03 NOTE — Progress Notes (Addendum)
? ? ? ?Subjective: ?3 Days Post-Op s/p Procedure(s): ?REPAIR OF RIGHT DISTAL FEMUR NONUNION ? ?States she's "not feeling like herself", feels tired. She has had oxycodone and 2 doses of dilaudid this morning. Denies dizziness or lightheadedness. States she is a little nauseated but has had a full breakfast and has been drinking. Denies chest pain or shortness of breath. Denies vomiting. Pain so far well controlled.  ? ? ?Objective:  ?PE: ?VITALS:   ?Vitals:  ? 07/02/21 0824 07/02/21 2005 07/03/21 0426 07/03/21 0740  ?BP: (!) 115/55 (!) 98/50 (!) 110/59 126/68  ?Pulse: 78 72 84 69  ?Resp: 18 20 19 16   ?Temp: 98 ?F (36.7 ?C) 98.3 ?F (36.8 ?C) 98.4 ?F (36.9 ?C) 98.5 ?F (36.9 ?C)  ?TempSrc:  Oral Oral Oral  ?SpO2: 92% 91% 91% 95%  ?Weight:      ?Height:      ? ?General: sitting up on side of bed, in no acute distress ?Resp: no increased work of breathing ?MSK: surgical incisions look clean and dry with dermabond in place. Dorsiflexion and plantarflexion intact. Tender to palpation at knee. No calf tenderness. No pain with passive or active motion at toes. Endorses sensation diffusely at foot. + DP pulse.  ? ? ?LABS ? ?Results for orders placed or performed during the hospital encounter of 06/30/21 (from the past 24 hour(s))  ?Glucose, capillary     Status: Abnormal  ? Collection Time: 07/02/21 11:38 AM  ?Result Value Ref Range  ? Glucose-Capillary 164 (H) 70 - 99 mg/dL  ?Glucose, capillary     Status: Abnormal  ? Collection Time: 07/02/21  4:46 PM  ?Result Value Ref Range  ? Glucose-Capillary 137 (H) 70 - 99 mg/dL  ?Glucose, capillary     Status: Abnormal  ? Collection Time: 07/02/21  9:03 PM  ?Result Value Ref Range  ? Glucose-Capillary 126 (H) 70 - 99 mg/dL  ?Glucose, capillary     Status: Abnormal  ? Collection Time: 07/03/21  8:07 AM  ?Result Value Ref Range  ? Glucose-Capillary 107 (H) 70 - 99 mg/dL  ? Comment 1 Notify RN   ? ? ?No results found. ? ?ASSESSMENT: Rebecca Kemp is a 66 y.o. female, 3 Days Post-Op s/p  REPAIR OF RIGHT DISTAL FEMUR NONUNION ?  ?CV/Blood loss: Acute blood loss anemia, Hgb 10.3 yesterday. Hemodynamically stable. Will get repeat CBC this morning ?  ?PLAN: ?Weightbearing: WBAT RLE ?ROM: Okay for unrestricted hip and knee motion as tolerated ?Incisional and dressing care: Okay to leave incisions open to air  ?Showering: Okay to begin showering with assistance 07/03/2021, incisions may get wet at this point ?Orthopedic device(s): None  ?Pain management:  ?1. Tylenol 1000 mg q 6 hours scheduled ?2. Tizanidine 4 mg q 8 hours PRN ?3. Oxycodone 5-15 mg q 4 hours PRN ?4. Dilaudid 1 mg q 3 hours PRN - will change to 0.5mg  q 4 hours ?5. Neurontin 800 mg 3 times daily ?VTE prophylaxis: Lovenox, SCDs ?ID:  Ancef 2gm post op completed ?Foley/Lines:  No foley, KVO IVFs ?Impediments to Fracture Healing: Vitamin D level 28, will continue on home dose supplementation (50,000 units q. 7 days) ?Dispo: Therapies as tolerated, PT recommending home health.  No OT follow-up needed.  No additional equipment recommendations at this time.  ?- patient does not look to be ready to discharge home today, BP stable this morning but did have a few hypotensive episodes last night. Will get repeat blood work this morning as well as continue  to monitor BP today, plans to work with PT again today. Plan to discharge home tomorrow if improved. I will keep dilaudid as prn option but would like her to try to decrease usage of IV dilaudid in preparation for discharge.  ?  ?D/C recommendations: ?-Oxycodone for pain control ?-Aspirin for DVT prophylaxis ?-Continue home dose Vit D supplementation ?  ?Follow - up plan: 2 weeks after discharge for wound check and repeat x-rays ? ?Contact information:   ?Weekdays [address] [place] [phone] A ?fter hours and holidays please check [email or web address] for group call information for [facility] ? ?Armida Sans ?07/03/2021, 9:11 AM  ?

## 2021-07-04 ENCOUNTER — Inpatient Hospital Stay (HOSPITAL_COMMUNITY): Payer: Medicare HMO

## 2021-07-04 LAB — CBC
HCT: 32.1 % — ABNORMAL LOW (ref 36.0–46.0)
Hemoglobin: 10.3 g/dL — ABNORMAL LOW (ref 12.0–15.0)
MCH: 29.8 pg (ref 26.0–34.0)
MCHC: 32.1 g/dL (ref 30.0–36.0)
MCV: 92.8 fL (ref 80.0–100.0)
Platelets: 204 10*3/uL (ref 150–400)
RBC: 3.46 MIL/uL — ABNORMAL LOW (ref 3.87–5.11)
RDW: 14.1 % (ref 11.5–15.5)
WBC: 5.3 10*3/uL (ref 4.0–10.5)
nRBC: 0 % (ref 0.0–0.2)

## 2021-07-04 LAB — BASIC METABOLIC PANEL
Anion gap: 7 (ref 5–15)
BUN: 48 mg/dL — ABNORMAL HIGH (ref 8–23)
CO2: 26 mmol/L (ref 22–32)
Calcium: 9.5 mg/dL (ref 8.9–10.3)
Chloride: 105 mmol/L (ref 98–111)
Creatinine, Ser: 1.62 mg/dL — ABNORMAL HIGH (ref 0.44–1.00)
GFR, Estimated: 35 mL/min — ABNORMAL LOW (ref >60)
Glucose, Bld: 146 mg/dL — ABNORMAL HIGH (ref 70–99)
Potassium: 4.4 mmol/L (ref 3.5–5.1)
Sodium: 138 mmol/L (ref 135–145)

## 2021-07-04 LAB — GLUCOSE, CAPILLARY
Glucose-Capillary: 110 mg/dL — ABNORMAL HIGH (ref 70–99)
Glucose-Capillary: 135 mg/dL — ABNORMAL HIGH (ref 70–99)

## 2021-07-04 MED ORDER — ASPIRIN 325 MG PO TBEC: 325.0000 mg | DELAYED_RELEASE_TABLET | Freq: Two times a day (BID) | ORAL | 0 refills | Status: AC

## 2021-07-04 NOTE — TOC Transition Note (Signed)
Transition of Care (TOC) - CM/SW Discharge Note ? ? ?Patient Details  ?Name: Rebecca Kemp ?MRN: [id number] ?Date of Birth: [birth year] ? ?Transition of Care (TOC) CM/SW Contact:  ?Bess Kinds, RN ?Phone Number: [phone] ?07/04/2021, 1:25 PM ? ? ?Clinical Narrative:    ? ?Patient to transition home today. Previous TOC RNCM had arranged OP Rehab and placed information on AVS. No further TOC needs identified at this time.  ? ?Final next level of care: OP Rehab ?Barriers to Discharge: No Barriers Identified ? ? ?Patient Goals and CMS Choice ?Patient states their goals for this hospitalization and ongoing recovery are:: to return to home ?CMS Medicare.gov Compare Post Acute Care list provided to:: Patient ?Choice offered to / list presented to : Patient ? ?Discharge Placement ?  ?           ?  ?  ?  ?  ? ?Discharge Plan and Services ?  ?Discharge Planning Services: CM Consult ?Post Acute Care Choice:  (OP PT)          ?DME Arranged: N/A ?  ?  ?  ?  ?HH Arranged: NA ?  ?  ?  ?  ? ?Social Determinants of Health (SDOH) Interventions ?  ? ? ?Readmission Risk Interventions ?   ? View : No data to display.  ?  ?  ?  ? ? ? ? ? ?

## 2021-07-04 NOTE — Progress Notes (Signed)
Mobility Specialist Progress Note  ? ? 07/04/21 1329  ?Mobility  ?Activity Ambulated with assistance in room  ?Level of Assistance Standby assist, set-up cues, supervision of patient - no hands on  ?Assistive Device Front wheel walker  ?RLE Weight Bearing WBAT  ?Distance Ambulated (ft) 20 ft  ?Activity Response Tolerated well  ?$Mobility charge 1 Mobility  ? ?Pt received sitting EOB and agreeable. C/o pain. Left in BR for void and to change clothes with RN present.  ? ?[place] ?Mobility Specialist  ?Primary: 5N M.S. Phone: [phone] ?Secondary: 6N M.S. Phone: [phone] ?  ?

## 2021-07-04 NOTE — Progress Notes (Addendum)
? ? ? ?Subjective: ?4 Days Post-Op s/p Procedure(s): ?REPAIR OF RIGHT DISTAL FEMUR NONUNION ? ?Patient feeling better this morning. Denies dizziness or lightheadedness. Denies chest pain or shortness of breath. Denies vomiting. Feels like she could be getting better pain control with 15 mg oxycodone rather than 10 mg oxycodone. Worried about ability to discharge today and have enough pain medication as her pharmacy who needs to fill her pain medication is not open today. States she does have some abdominal pain today, though this is normal for her, she has intermittent abdominal pain at baseline. States she has had 2 normal appearing bowel movements in last 24 hours. Passing gas.   ? ? ?Objective:  ?PE: ?VITALS:   ?Vitals:  ? 07/03/21 0740 07/03/21 1523 07/03/21 2131 07/04/21 0757  ?BP: 126/68 110/72 130/66 134/77  ?Pulse: 69 65 76 73  ?Resp: 16 16 18 18   ?Temp: 98.5 ?F (36.9 ?C) 98 ?F (36.7 ?C) (!) 97.5 ?F (36.4 ?C) 98.4 ?F (36.9 ?C)  ?TempSrc: Oral Oral Oral Oral  ?SpO2: 95% 95% 97% 96%  ?Weight:      ?Height:      ? ?General: sitting up on side of bed, in no acute distress ?Resp: no increased work of breathing ?GI: abdomen is soft, not tender to palpation ?MSK: Diffuse swelling to bilateral lower extremities, patient states this is improved from her baseline edema. surgical incisions look clean and dry with dermabond in place. Dorsiflexion and plantarflexion intact. Tender to palpation at knee. No calf tenderness. No pain with passive or active motion at toes. Endorses sensation diffusely at foot. + DP pulse.  ? ? ?LABS ? ?Results for orders placed or performed during the hospital encounter of 06/30/21 (from the past 24 hour(s))  ?CBC     Status: Abnormal  ? Collection Time: 07/03/21 10:36 AM  ?Result Value Ref Range  ? WBC 9.7 4.0 - 10.5 K/uL  ? RBC 3.28 (L) 3.87 - 5.11 MIL/uL  ? Hemoglobin 9.9 (L) 12.0 - 15.0 g/dL  ? HCT 30.9 (L) 36.0 - 46.0 %  ? MCV 94.2 80.0 - 100.0 fL  ? MCH 30.2 26.0 - 34.0 pg  ? MCHC 32.0  30.0 - 36.0 g/dL  ? RDW 14.3 11.5 - 15.5 %  ? Platelets 234 150 - 400 K/uL  ? nRBC 0.0 0.0 - 0.2 %  ?Basic metabolic panel     Status: Abnormal  ? Collection Time: 07/03/21 10:36 AM  ?Result Value Ref Range  ? Sodium 133 (L) 135 - 145 mmol/L  ? Potassium 4.9 3.5 - 5.1 mmol/L  ? Chloride 104 98 - 111 mmol/L  ? CO2 21 (L) 22 - 32 mmol/L  ? Glucose, Bld 129 (H) 70 - 99 mg/dL  ? BUN 53 (H) 8 - 23 mg/dL  ? Creatinine, Ser 2.12 (H) 0.44 - 1.00 mg/dL  ? Calcium 8.9 8.9 - 10.3 mg/dL  ? GFR, Estimated 26 (L) >60 mL/min  ? Anion gap 8 5 - 15  ?Glucose, capillary     Status: Abnormal  ? Collection Time: 07/03/21 11:28 AM  ?Result Value Ref Range  ? Glucose-Capillary 119 (H) 70 - 99 mg/dL  ?Glucose, capillary     Status: Abnormal  ? Collection Time: 07/03/21  4:13 PM  ?Result Value Ref Range  ? Glucose-Capillary 135 (H) 70 - 99 mg/dL  ?Glucose, capillary     Status: Abnormal  ? Collection Time: 07/03/21  9:46 PM  ?Result Value Ref Range  ? Glucose-Capillary 129 (H)  70 - 99 mg/dL  ?Glucose, capillary     Status: Abnormal  ? Collection Time: 07/04/21  7:51 AM  ?Result Value Ref Range  ? Glucose-Capillary 110 (H) 70 - 99 mg/dL  ? ? ?No results found. ? ?ASSESSMENT: Rebecca Kemp is a 66 y.o. female, 3 Days Post-Op s/p REPAIR OF RIGHT DISTAL FEMUR NONUNION ?  ?CV/Blood loss: Acute blood loss anemia, Hgb 9.9 yesterday. Hemodynamically stable. Will get repeat CBC this morning ?  ?PLAN: ?Weightbearing: WBAT RLE ?ROM: Okay for unrestricted hip and knee motion as tolerated ?Incisional and dressing care: Okay to leave incisions open to air  ?Showering: Okay to begin showering with assistance 07/03/2021, incisions may get wet at this point ?Orthopedic device(s): None  ?Pain management:  ?1. Tylenol 1000 mg q 6 hours scheduled ?2. Tizanidine 4 mg q 8 hours PRN ?3. Oxycodone 5-15 mg q 4 hours PRN ?4. Dilaudid 0.5mg  q 4 hours ?5. Neurontin 600 mg 3 times daily ?VTE prophylaxis: Lovenox, SCDs ?ID:  Ancef 2gm post op completed ?Foley/Lines:  No  foley, KVO IVFs ?Impediments to Fracture Healing: Vitamin D level 28, will continue on home dose supplementation (50,000 units q. 7 days) ?Dispo: Therapies as tolerated, PT recommending home health.  No OT follow-up needed.  No additional equipment recommendations at this time.  ?- BP has stabilized overnight. I will await repeat CBC and BMP this morning. Will discuss pain medication with patient. If we are unable to have enough home pain medication and cannot get it filled at another open pharmacy today due to her baseline use, may need to keep overnight until she has complete discharge plan tomorrow ?  ?D/C recommendations: ?-Oxycodone for pain control ?-Aspirin for DVT prophylaxis ?-Continue home dose Vit D supplementation ?  ?Follow - up plan: 2 weeks after discharge for wound check and repeat x-rays ? ?Contact information:   ?Weekdays [address] [place] [phone] A ?fter hours and holidays please check [email or web address] for group call information for [facility] ? ?Armida Sans ?07/04/2021, 10:08 AM  ?

## 2021-07-04 NOTE — Progress Notes (Addendum)
Discussed with patient, labs improved today Cr trending down to 1.62 today, Hbg increased to 10.3. Ordered abdominal x-ray which indicates stool but no obstruction. Discussed with patient pain medicine needs, she received 56 oxycodone pills on 4/17, she has not used 5 days worth of these pills while inpatient. She can use her home oxycodone 5's 2-3 pills q 4-6 hours until she can pick up her oxycodone Rx at her local pharmacy in [place]. Patient is agreeable to plan. Plan to discharge home later this afternoon, patient will receive oxycodone dose just prior to discharge.  ? ?Merlene Pulling, PA-C ?

## 2021-07-04 NOTE — Discharge Summary (Signed)
Discharge Summary  ?Patient ID: ?Latana M Lebo ?MRN: [id number] ?DOB/AGE: 1955/07/27 66 y.o. ? ?Admit date: 06/30/2021 ?Discharge date: 07/04/2021 ? 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 ?Discharge Diagnoses:  ?Principal Problem: ?  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 ? ?Past Medical History:  ?Diagnosis Date  ? Anxiety   ? Asthma   ? Complication of anesthesia   ? per daughter took 13 hours to wake up one time-pt reports this was because of too much IV pain medicine (fentanyl)  ? Diabetes (HCC) 09/02/2020  ? GERD (gastroesophageal reflux disease)   ? Hypertension   ? Neuropathy   ? Sleep apnea   ? ? ?Surgeries: Procedure(s): ?REPAIR OF RIGHT DISTAL FEMUR NONUNION on 06/30/2021 ?  ?Consultants (if any):  ? ?Discharged Condition: Improved ? ?Hospital Course: SOKHNA CHRISTOPH is an 66 y.o. 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  She had soft blood pressures post-operatively which resolved after increasing fluids and decreasing pain medication. Patient benefited maximally from her hospital stay.  ? ?She was given perioperative antibiotics:  ?Anti-infectives (From admission, onward)  ? ? Start     Dose/Rate Route Frequency Ordered Stop  ? 06/30/21 1600  ceFAZolin (ANCEF) IVPB 2g/100 mL premix       ? 2 g ?200 mL/hr over 30 Minutes Intravenous Every 8 hours 06/30/21 1309 07/01/21 0856  ? 06/30/21 0821  vancomycin (VANCOCIN) powder  Status:  Discontinued       ?   As needed 06/30/21 0821 06/30/21 1005  ? 06/30/21 0630  ceFAZolin (ANCEF) IVPB 3g/100 mL premix       ? 3  g ?200 mL/hr over 30 Minutes Intravenous On call to O.R. 06/30/21 3893 06/30/21 0755  ? ?  ?. ? ? ?Recent vital signs:  ?Vitals:  ? 07/03/21 2131 07/04/21 0757  ?BP: 130/66 134/77  ?Pulse: 76 73  ?Resp: 18 18  ?Temp: (!) 97.5 ?F (36.4 ?C) 98.4 ?F (36.9 ?C)  ?SpO2: 97% 96%  ? ? ?Recent laboratory studies:  ?Lab Results  ?Component Value Date  ? HGB 10.3 (L) 07/04/2021  ? HGB 9.9 (L) 07/03/2021  ? HGB 10.3 (L) 07/02/2021  ? ?Lab Results  ?Component Value Date  ? WBC 5.3 07/04/2021  ? PLT 204 07/04/2021  ? ?Lab Results  ?Component Value Date  ? INR 1.2 09/03/2020  ? ?Lab Results  ?Component Value Date  ? NA 138 07/04/2021  ? K 4.4 07/04/2021  ? CL 105 07/04/2021  ? CO2 26 07/04/2021  ? BUN 48 (H) 07/04/2021  ? CREATININE 1.62 (H) 07/04/2021  ? GLUCOSE 146 (H) 07/04/2021  ? ? ?Discharge Medications:   ?Allergies as of 07/04/2021   ? ?   Reactions  ? Iodinated Contrast Media Nausea And Vomiting, Rash  ? Metformin Diarrhea  ? Morphine Itching, Rash  ? ?  ? ?  ?Medication List  ?  ? ?TAKE these medications   ? ?amLODipine 5 MG tablet ?Commonly known as: NORVASC ?Take 5 mg by mouth daily. ?  ?aspirin 325 MG EC tablet ?Take 1 tablet (325 mg total) by mouth in the morning and at bedtime. ?  ?dicyclomine 10  MG capsule ?Commonly known as: BENTYL ?Take 10 mg by mouth 3 (three) times daily before meals. ?  ?escitalopram 20 MG tablet ?Commonly known as: LEXAPRO ?Take 20 mg by mouth daily. ?  ?furosemide 20 MG tablet ?Commonly known as: LASIX ?Take 20 mg by mouth daily. ?  ?gabapentin 800 MG tablet ?Commonly known as: NEURONTIN ?Take 800 mg by mouth 3 (three) times daily. ?  ?hydrALAZINE 25 MG tablet ?Commonly known as: APRESOLINE ?Take 25 mg by mouth 3 (three) times daily. ?  ?ibuprofen 800 MG tablet ?Commonly known as: ADVIL ?Take 800 mg by mouth every 8 (eight) hours as needed for pain. ?  ?montelukast 10 MG tablet ?Commonly known as: SINGULAIR ?Take 10 mg by mouth daily. ?  ?olmesartan-hydrochlorothiazide 40-25 MG  tablet ?Commonly known as: BENICAR HCT ?Take 1 tablet by mouth daily. ?  ?oxyCODONE 15 MG immediate release tablet ?Commonly known as: ROXICODONE ?Take 1 tablet (15 mg total) by mouth every 4 (four) hours as needed. ?What changed:  ?medication strength ?how much to take ?  ?pantoprazole 40 MG tablet ?Commonly known as: PROTONIX ?Take 40 mg by mouth daily. ?  ?promethazine 25 MG tablet ?Commonly known as: PHENERGAN ?Take 25 mg by mouth every 6 (six) hours as needed for nausea/vomiting. ?  ?tiZANidine 4 MG tablet ?Commonly known as: ZANAFLEX ?Take 4 mg by mouth every 8 (eight) hours as needed for muscle spasms. ?  ?Evaristo Buryresiba FlexTouch 200 UNIT/ML FlexTouch Pen ?Generic drug: insulin degludec ?Inject 48 Units into the skin daily. ?  ?Ventolin HFA 108 (90 Base) MCG/ACT inhaler ?Generic drug: albuterol ?Inhale 2 puffs into the lungs every 4 (four) hours as needed for shortness of breath or wheezing. ?  ?Victoza 18 MG/3ML Sopn ?Generic drug: liraglutide ?Inject 1.8 mg into the skin daily. ?  ?Vitamin D (Ergocalciferol) 1.25 MG (50000 UNIT) Caps capsule ?Commonly known as: DRISDOL ?Take 50,000 Units by mouth once a week. wednesday ?  ? ?  ? ? ?Diagnostic Studies: DG Abd 1 View ? ?Result Date: 07/04/2021 ?CLINICAL DATA:  Abdominal pain. EXAM: ABDOMEN - 1 VIEW COMPARISON:  Radiograph 09/05/2020 FINDINGS: Cholecystectomy clips in the right upper quadrant. Moderate volume of stool throughout the colon. No bowel dilatation or evidence of obstruction. More detailed assessment is limited due to soft tissue attenuation from habitus. IMPRESSION: Moderate volume of colonic stool. No bowel obstruction. Electronically Signed   By: Narda RutherfordMelanie  Sanford M.D.   On: 07/04/2021 12:36  ? ?DG C-Arm 1-60 Min-No Report ? ?Result Date: 06/30/2021 ?Fluoroscopy was utilized by the requesting physician.  No radiographic interpretation.  ? ?DG C-Arm 1-60 Min-No Report ? ?Result Date: 06/30/2021 ?Fluoroscopy was utilized by the requesting physician.  No  radiographic interpretation.  ? ?DG FEMUR, MIN 2 VIEWS RIGHT ? ?Result Date: 06/30/2021 ?CLINICAL DATA:  ORIF distal right femur. EXAM: RIGHT FEMUR 2 VIEWS COMPARISON:  Right femur radiographs 09/03/2020 FINDINGS: Images were performed intraoperatively without the presence of a radiologist. The patient is status post prior distal femoral lateral plate and screw fixation of an oblique distal femoral fracture. There is now new placement of a distal medial femoral plate and screws traversing the distal fracture. Improved alignment. Total fluoroscopy images: 7 Total fluoroscopy time: 61 seconds Total dose: Radiation Exposure Index (as provided by the fluoroscopic device): 10.99 mGy air Kerma Please see intraoperative findings for further detail. IMPRESSION: Operative fluoroscopy for distal femoral ORIF. Electronically Signed   By: Neita Garnetonald  Viola M.D.   On: 06/30/2021 10:06  ? ?DG FEMUR PORT,  MIN 2 VIEWS RIGHT ? ?Result Date: 06/30/2021 ?CLINICAL DATA:  Postop evaluation EXAM: RIGHT FEMUR PORTABLE 2 VIEW COMPARISON:  09/03/2020 FINDINGS: Limited study with difficulty positioning the patient. Lateral plate and screws in the mid and distal femur unchanged from prior study. Interval placement of medial plate and screws in the distal femur. Fracture of the distal supracondylar femur with on going healing changes. There is callus formation around the fracture. Gas is present the soft tissues of the distal thigh anteriorly. Degenerative change in the knee with joint space narrowing most prominent the patellofemoral joint. IMPRESSION: ORIF distal femur fracture with ongoing healing of the fracture. Electronically Signed   By: Marlan Palau M.D.   On: 06/30/2021 11:02   ? ?Disposition: Discharge disposition: 01-Home or Self Care ? ? ? ? ? ? ? ? ? Follow-up Information   ? ? [facility] location. Schedule an appointment as soon as possible for a visit.   ?Contact information: ?[address] ?[place]  , [place] [address]  ?phone [phone] ? ?  ?  ? ? Haddix, Gillie Manners, MD Follow up.   ?Specialty: Orthopedic Surgery ?Why: Jul 13, 2021 at 0900 am ?Contact information: ?[address] ?[place] [address] ?[phone] ? ? ?  ?

## 2022-09-12 IMAGING — CT CT HEAD W/O CM
3 of 4 series · 15 of 47 positions shown, 18 images · non-contrast
Comparison: [facility] Brain MRI 04/01/2010. Head CT
05/23/2014.

CLINICAL DATA: 64-year-old female with altered mental status and
fall.

EXAM:
CT HEAD WITHOUT CONTRAST
TECHNIQUE: Contiguous axial images were obtained from the base of the skull
through the vertex without intravenous contrast.

[Series 4: head 2.0 h70h · axial · 0.41mm/px · z∈[-184,-54]mm · 9 of 83 slices shown, 12 images]
[im 9/83  brain]
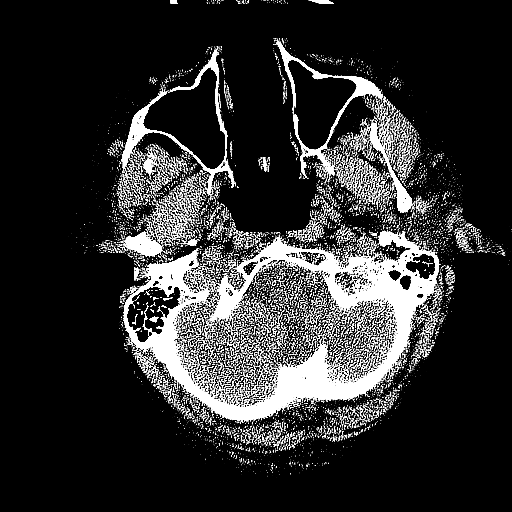
[im 9/83  bone]
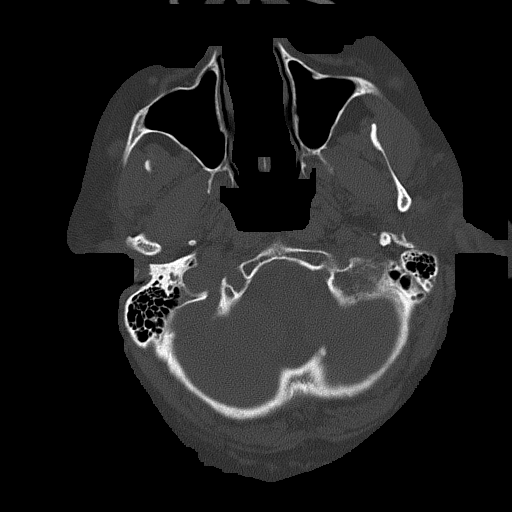
[im 17/83  brain]
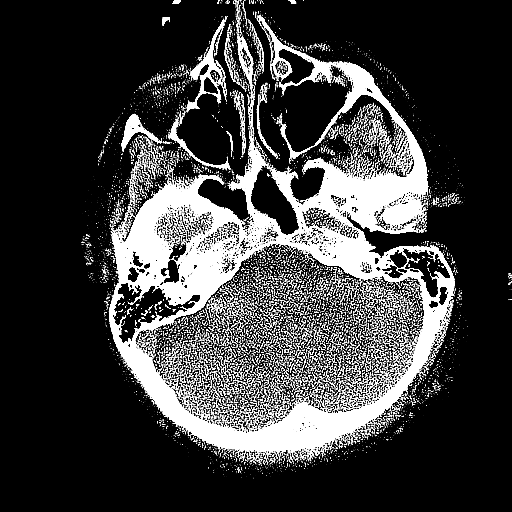
[im 25/83  brain]
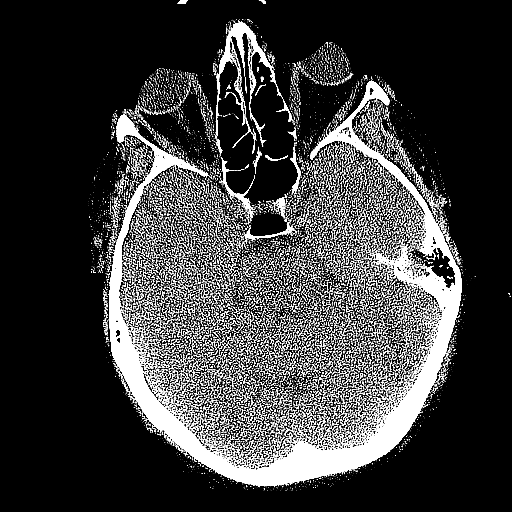
[im 33/83  brain]
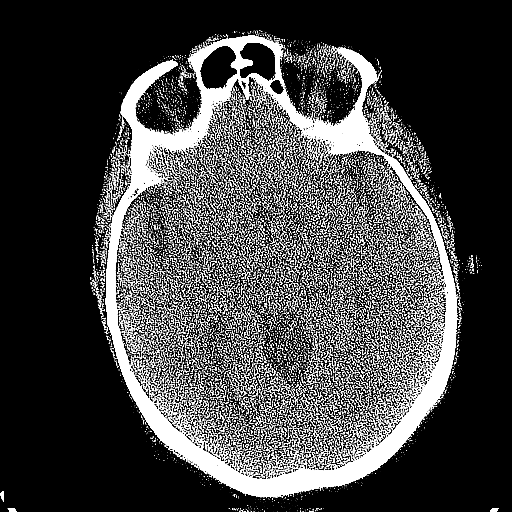
[im 42/83  brain]
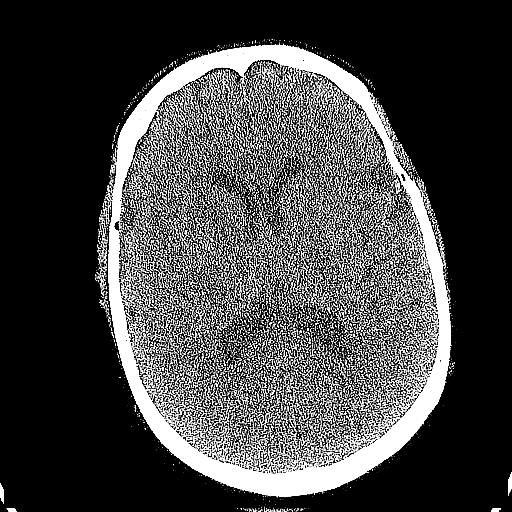
[im 42/83  bone]
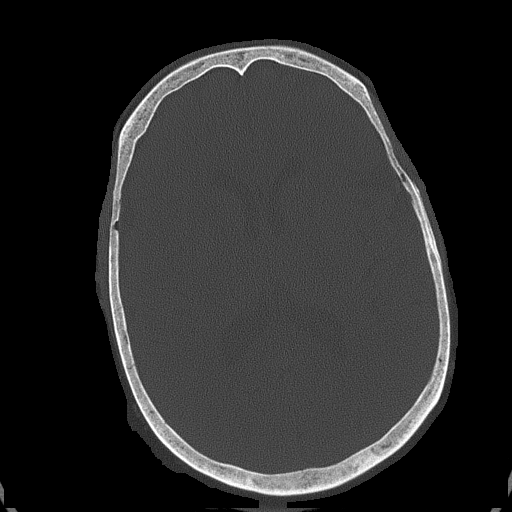
[im 50/83  brain]
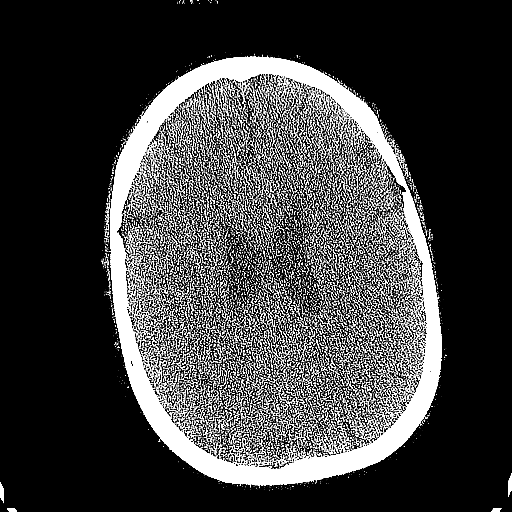
[im 58/83  brain]
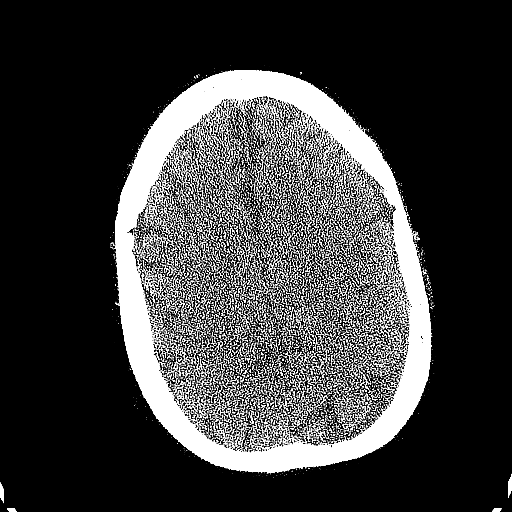
[im 66/83  brain]
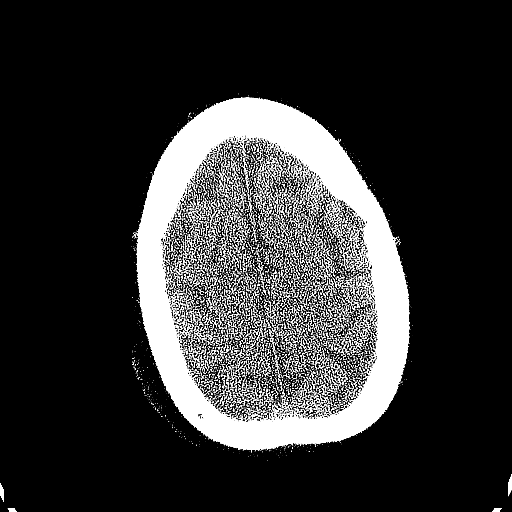
[im 74/83  brain]
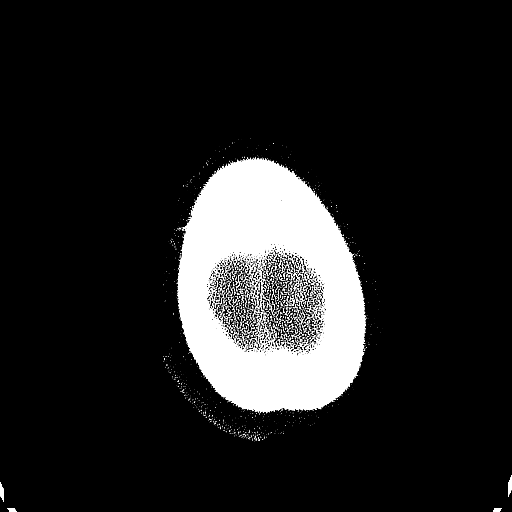
[im 74/83  bone]
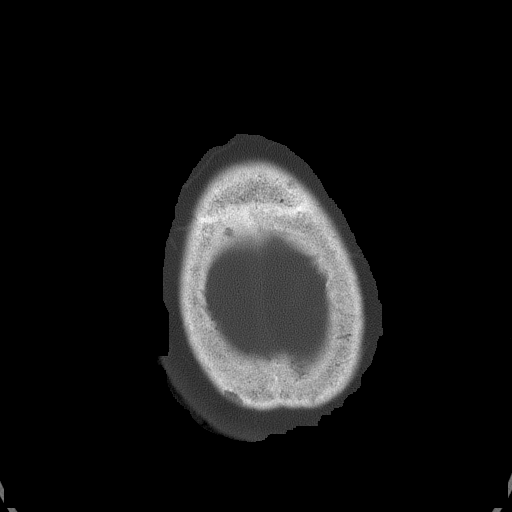

[Series 5: head 3.0 mpr cor · coronal · 0.32mm/px · 3 of 67 slices shown]
[im 23/67  brain]
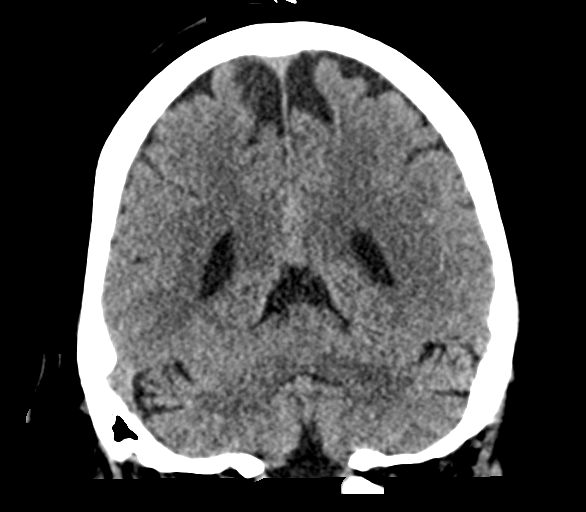
[im 30/67  brain]
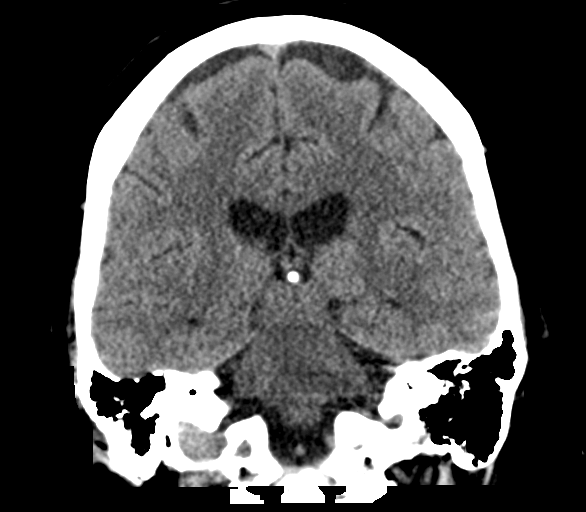
[im 37/67  brain]
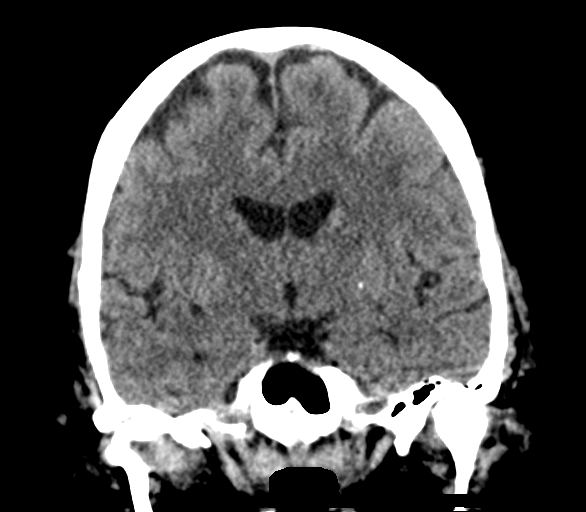

[Series 6: head 3.0 mpr sag · sagittal · 0.32mm/px · 3 of 60 slices shown]
[im 20/60  brain]
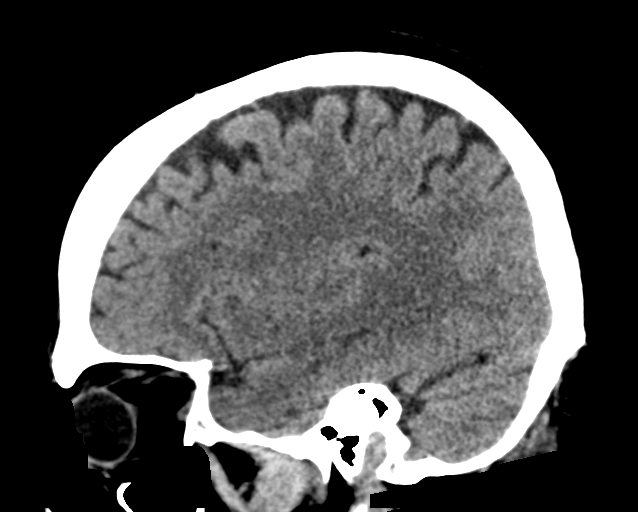
[im 30/60  brain]
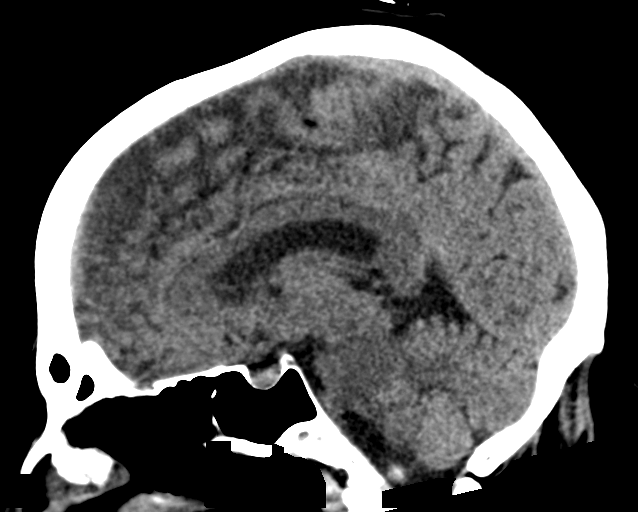
[im 40/60  brain]
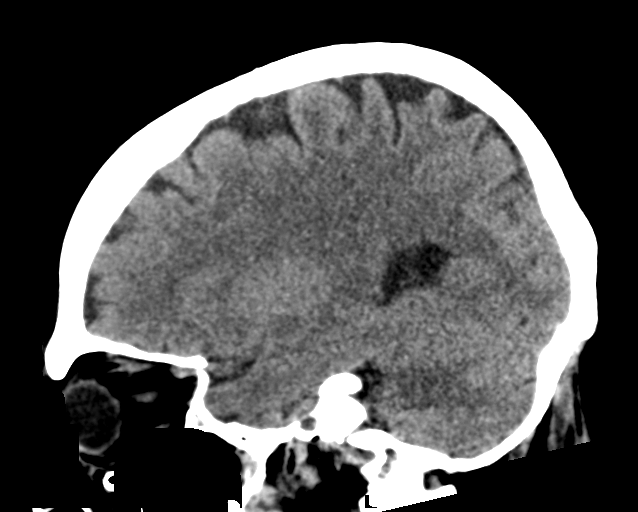

[15 of 47 positions shown; findings below may reference images not displayed]

FINDINGS: Brain: Cerebral volume is not significantly changed since 2730. No
midline shift, ventriculomegaly, mass effect, evidence of mass
lesion, intracranial hemorrhage or evidence of cortically based
acute infarction. Hypodensity in the bilateral anterior limb white
matter internal capsules has progressed since 2730. Mild associated
heterogeneity in the left thalamus is new. But mild for age
subcortical white matter hypodensity elsewhere. No cortical
encephalomalacia identified. Small dystrophic calcification left
lentiform.

Vascular: No suspicious intracranial vascular hyperdensity.

Skull: Intact.  No acute osseous abnormality identified.

Sinuses/Orbits: Trace fluid in the hyperplastic left sphenoid sinus
is chronic. Other visualized paranasal sinuses and mastoids are
clear.

Other: No orbit or scalp soft tissue injury identified.
IMPRESSION: 1. No acute intracranial abnormality or acute traumatic injury
identified.
2. Progression of small vessel disease changes in the deep white
matter capsules since [DATE].

## 2022-09-13 IMAGING — RF DG FEMUR 2+V*R*
1 series · 5 of 5 positions shown · non-contrast
Comparison: CT of the right lower extremity 08/31/2020.

CLINICAL DATA: Surgery, elective. Additional history provided: Open
reduction internal fixation (ORIF) distal femur fracture (right).
Provided fluoroscopy time 1 minutes, 20 seconds (10.86 mGy).

EXAM:
RIGHT FEMUR 2 VIEWS; DG C-ARM 1-60 MIN

[Series 1: run · 5 of 5 slices shown]
[im 1/5]
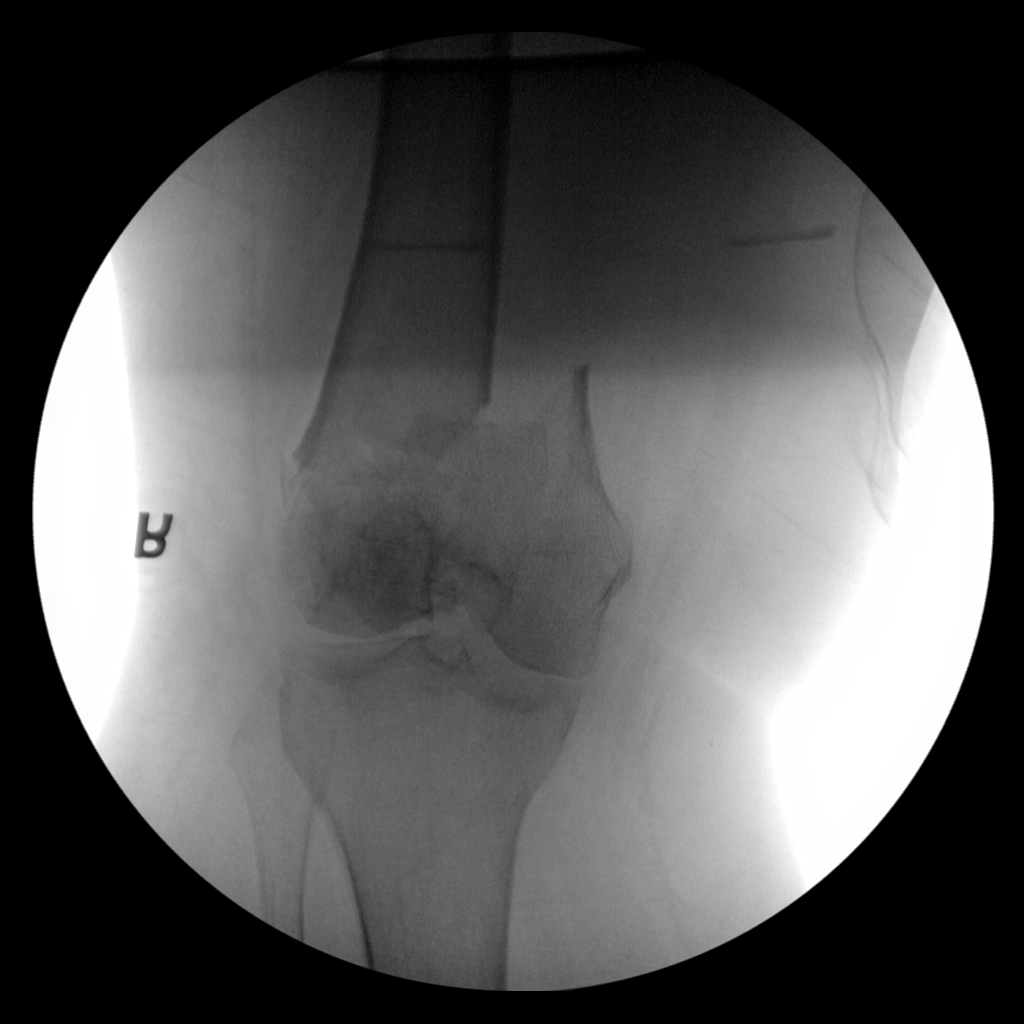
[im 2/5]
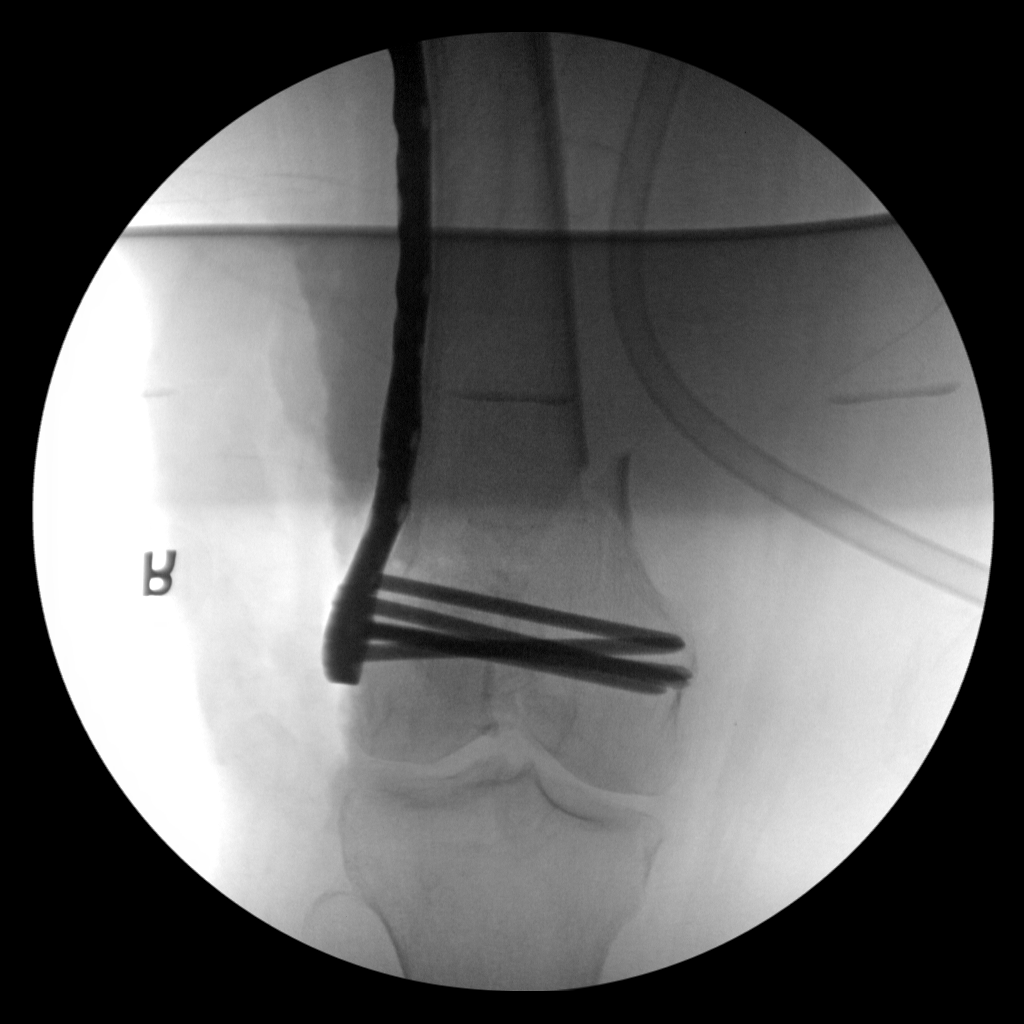
[im 3/5]
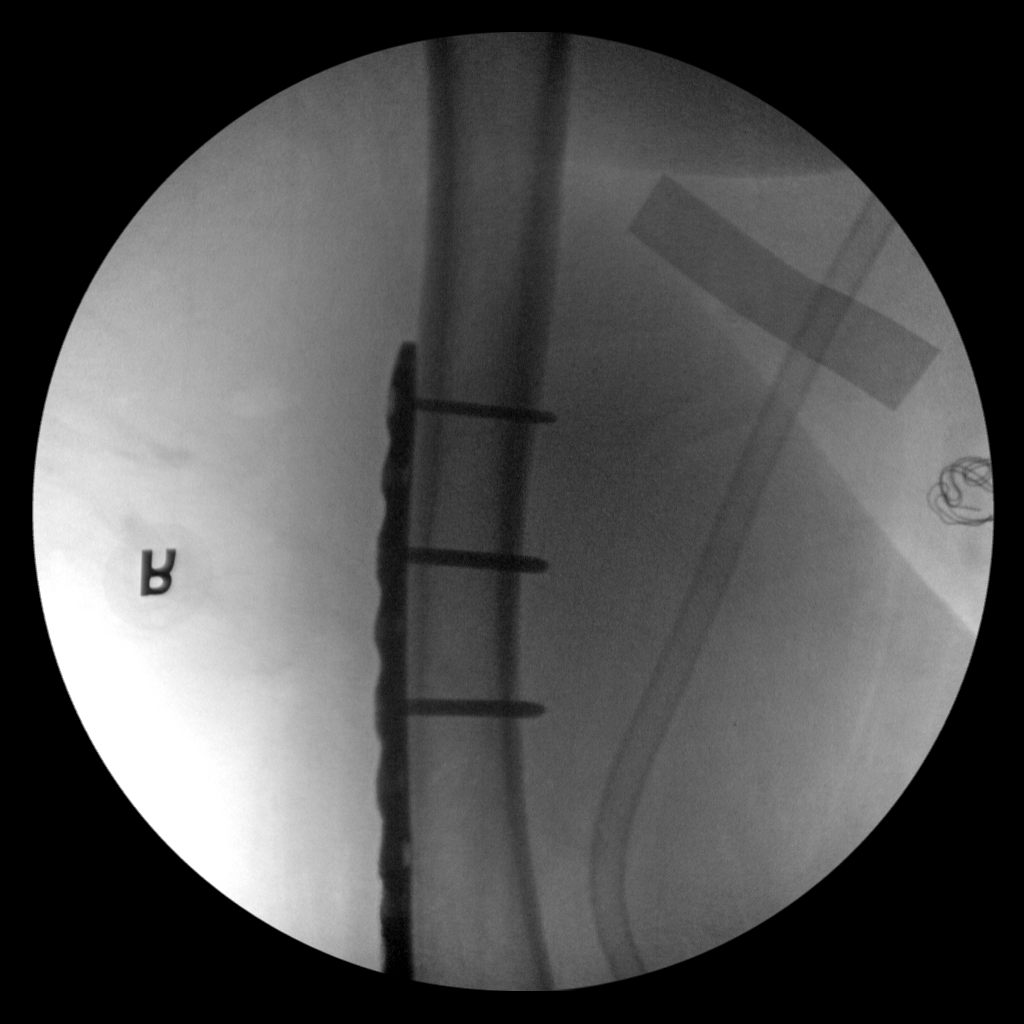
[im 4/5]
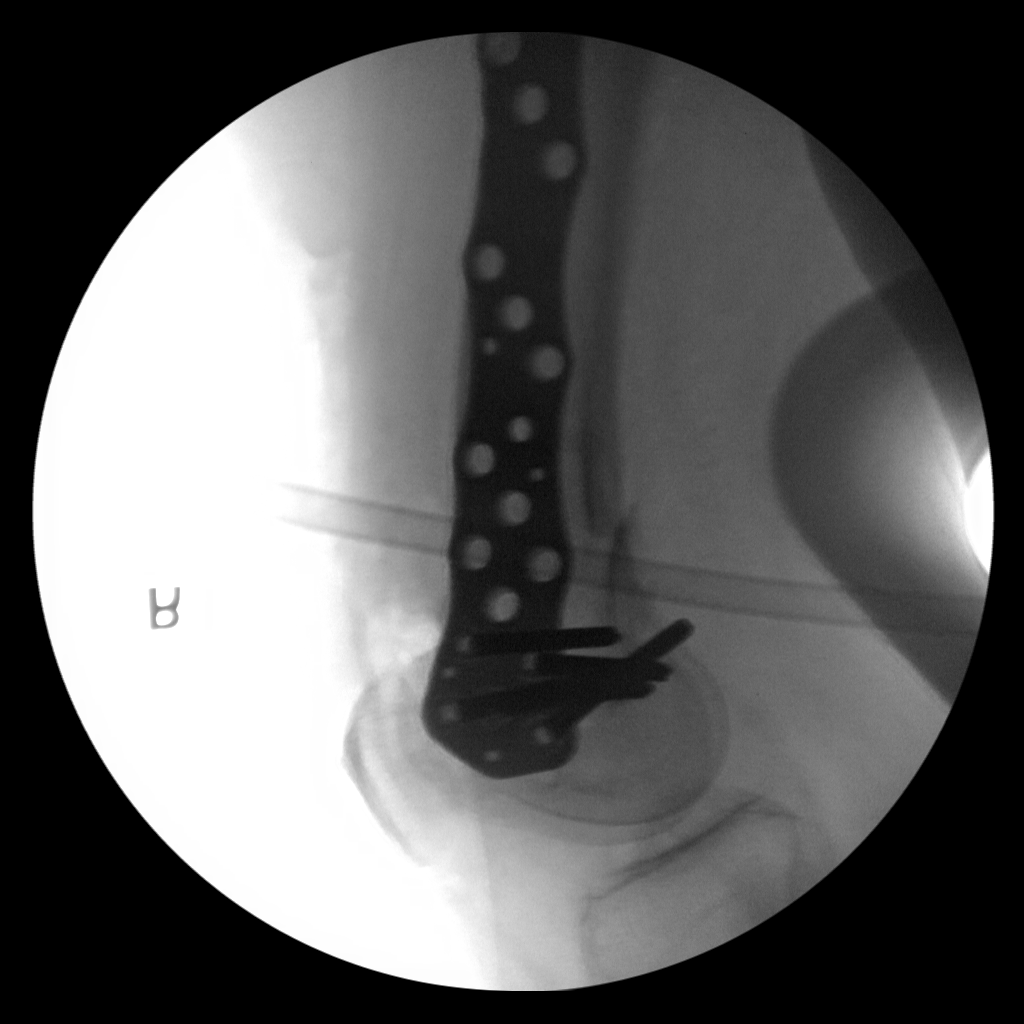
[im 5/5]
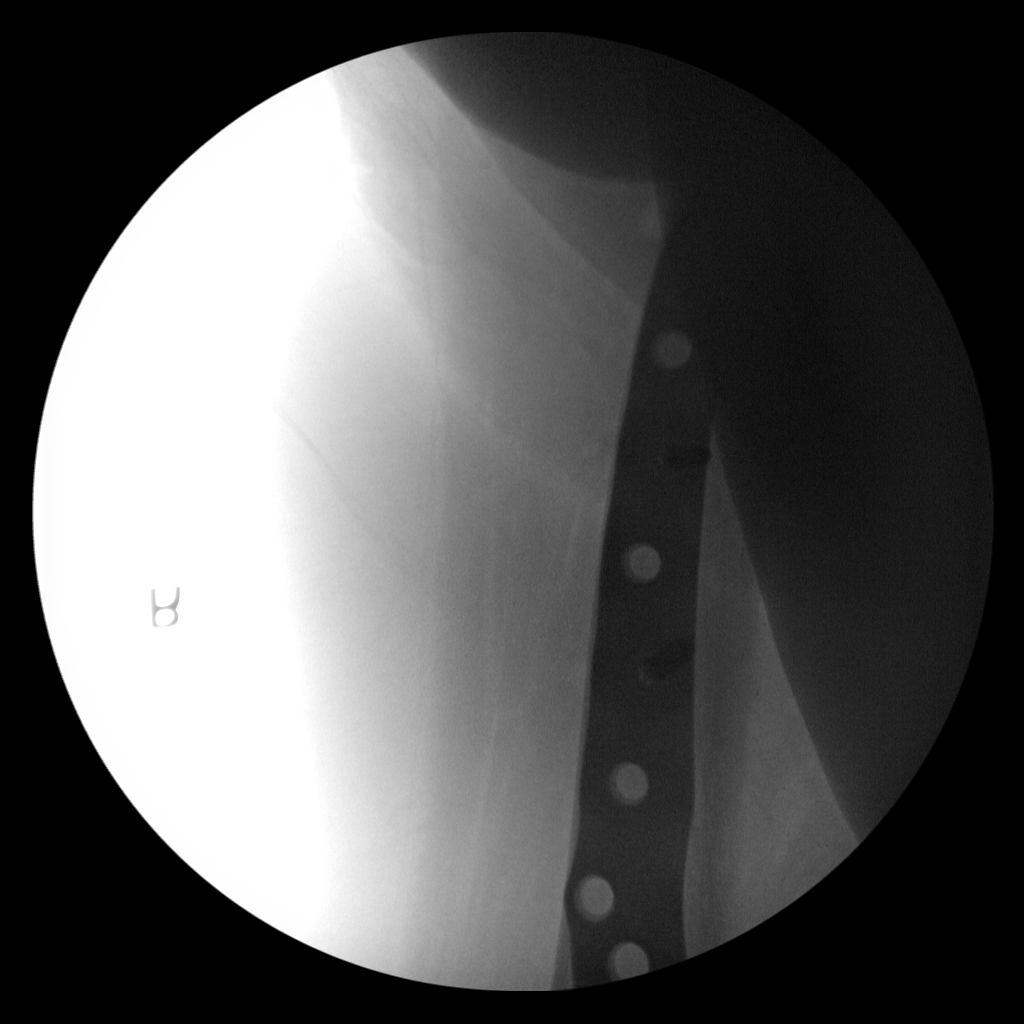

[5 of 5 positions shown; findings below may reference images not displayed]

FINDINGS: Five intraoperative fluoroscopic images of the right femur are
submitted. On the provided images, there are findings of interval
ORIF of a known comminuted distal right femoral fracture utilizing a
lateral plate and multiple interlocking screws. Persistent although
improved displacement at the fracture sites.
IMPRESSION: Five intraoperative fluoroscopic images of the right femur from
right femoral ORIF, as described.

## 2023-04-07 DIAGNOSIS — I4891 Unspecified atrial fibrillation: Secondary | ICD-10-CM | POA: Diagnosis not present

## 2023-04-08 DIAGNOSIS — I361 Nonrheumatic tricuspid (valve) insufficiency: Secondary | ICD-10-CM | POA: Diagnosis not present

## 2023-05-04 DIAGNOSIS — I4891 Unspecified atrial fibrillation: Secondary | ICD-10-CM | POA: Diagnosis not present

## 2023-05-16 ENCOUNTER — Encounter: Payer: Self-pay | Admitting: Internal Medicine
